# Patient Record
Sex: Female | Born: 2000 | Race: Black or African American | Hispanic: No | Marital: Single | Smoking: Never smoker
Health system: Southern US, Community
[De-identification: ages and names within clinical notes are randomized; demographics above are authoritative.]

## PROBLEM LIST (undated history)

## (undated) DIAGNOSIS — D563 Thalassemia minor: Secondary | ICD-10-CM

## (undated) DIAGNOSIS — Z789 Other specified health status: Secondary | ICD-10-CM

## (undated) HISTORY — PX: NO PAST SURGERIES: SHX2092

---
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
## 2020-03-22 DIAGNOSIS — Z20828 Contact with and (suspected) exposure to other viral communicable diseases: Secondary | ICD-10-CM | POA: Diagnosis not present

## 2020-03-26 DIAGNOSIS — R519 Headache, unspecified: Secondary | ICD-10-CM | POA: Diagnosis not present

## 2020-03-26 DIAGNOSIS — R061 Stridor: Secondary | ICD-10-CM | POA: Diagnosis not present

## 2020-03-26 DIAGNOSIS — Z20828 Contact with and (suspected) exposure to other viral communicable diseases: Secondary | ICD-10-CM | POA: Diagnosis not present

## 2020-03-26 DIAGNOSIS — R051 Acute cough: Secondary | ICD-10-CM | POA: Diagnosis not present

## 2020-03-26 DIAGNOSIS — K591 Functional diarrhea: Secondary | ICD-10-CM | POA: Diagnosis not present

## 2020-05-24 ENCOUNTER — Encounter: Payer: Self-pay | Admitting: Nurse Practitioner

## 2020-05-24 ENCOUNTER — Other Ambulatory Visit: Payer: Self-pay

## 2020-05-24 ENCOUNTER — Ambulatory Visit (INDEPENDENT_AMBULATORY_CARE_PROVIDER_SITE_OTHER): Payer: Medicaid Other | Admitting: Nurse Practitioner

## 2020-05-24 ENCOUNTER — Other Ambulatory Visit (HOSPITAL_COMMUNITY)
Admission: RE | Admit: 2020-05-24 | Discharge: 2020-05-24 | Disposition: A | Discharge status: Home / Self Care | Payer: Medicaid Other | Source: Ambulatory Visit | Attending: Nurse Practitioner | Admitting: Nurse Practitioner

## 2020-05-24 VITALS — BP 141/93 | HR 107 | Ht 64.0 in | Wt 120.4 lb

## 2020-05-24 DIAGNOSIS — Z3041 Encounter for surveillance of contraceptive pills: Secondary | ICD-10-CM

## 2020-05-24 DIAGNOSIS — Z975 Presence of (intrauterine) contraceptive device: Secondary | ICD-10-CM | POA: Insufficient documentation

## 2020-05-24 DIAGNOSIS — N921 Excessive and frequent menstruation with irregular cycle: Secondary | ICD-10-CM | POA: Diagnosis present

## 2020-05-24 DIAGNOSIS — R030 Elevated blood-pressure reading, without diagnosis of hypertension: Secondary | ICD-10-CM

## 2020-05-24 DIAGNOSIS — F4321 Adjustment disorder with depressed mood: Secondary | ICD-10-CM

## 2020-05-24 DIAGNOSIS — N939 Abnormal uterine and vaginal bleeding, unspecified: Secondary | ICD-10-CM | POA: Diagnosis not present

## 2020-05-24 DIAGNOSIS — N898 Other specified noninflammatory disorders of vagina: Secondary | ICD-10-CM | POA: Diagnosis not present

## 2020-05-24 MED ORDER — LEVONORGESTREL-ETHINYL ESTRAD 0.15-30 MG-MCG PO TABS: 1.0000 | ORAL_TABLET | Freq: Every day | ORAL | 11 refills | Status: DC

## 2020-05-24 NOTE — Progress Notes (Signed)
Patient presents for birth control consult. Patient states that she does not like the way the pills make her feel, and she is having breakthrough bleeding. She has been on the ocp for about a year. She is interested in switching to the patch. Patient declines flu vaccine today.

## 2020-05-24 NOTE — Progress Notes (Signed)
GYNECOLOGY OFFICE VISIT NOTE   History:  20 y.o. G0P0000 here today for abnormal bleeding on pills. Has taken pills while on Nexplanon to help with intermittent bleeding that continues for approx 18 months on Nexplanon.  She had Nexplanon removed and continued the pills but is having worsening periodic bleeding between menstrual cycles.  She denies any abnormal  pelvic pain or other concerns.   History reviewed. No pertinent past medical history.  History reviewed. No pertinent surgical history.  The following portions of the patient's history were reviewed and updated as appropriate: allergies, current medications, past family history, past medical history, past social history, past surgical history and problem list.     Review of Systems:  Pertinent items noted in HPI and remainder of comprehensive ROS otherwise negative.  Objective:  Physical Exam BP (!) 141/93   Pulse (!) 107   Ht 5\' 4"  (1.626 m)   Wt 120 lb 6.4 oz (54.6 kg)   BMI 20.67 kg/m  CONSTITUTIONAL: Well-developed, well-nourished female in no acute distress.  HENT:  Normocephalic, atraumatic. External right and left ear normal.  EYES: Conjunctivae and EOM are normal. Pupils are equal, round.  No scleral icterus.  NECK: Normal range of motion, supple, no masses SKIN: Skin is warm and dry. No rash noted. Not diaphoretic. No erythema. No pallor. NEUROLOGIC: Alert and oriented to person, place, and time. Normal muscle tone coordination. No cranial nerve deficit noted. PSYCHIATRIC: Normal mood and affect. Normal behavior. Normal judgment and thought content. CARDIOVASCULAR: Normal heart rate noted RESPIRATORY: Effort and breath sounds normal, no problems with respiration noted ABDOMEN: Soft, no distention noted.   PELVIC: Deferred MUSCULOSKELETAL: Normal range of motion. No edema noted.  Labs and Imaging No results found.  Assessment & Plan:  1. Abnormal uterine bleeding History of 4 days menses with no cramping  before starting hormonal contraception.  Bled on Nexplanon and had pills to help her with that irregualar bleeding for about one year.  Now she is taking pills and having breakthrough bleeding.  Reports taking pills daily and not skipping pills.  Sometimes the bleeding is spotting and sometimes it is 3 days of heavy bleeding and cramping such that this is worse than her cycles without hormones.  Is currently on a triphasic pill with norethindrone as the progesterone.  Will switch to monophasic pill with levonorgestril and 30 mcg of estrogen.  Would step to levonorgestril and 35 mcg of estrogen if BP is normal and spotting continued.  Will start this new pack of pills on Sunday and return in 4 weeks for check of BP and status of bleeding. Also did self swab to make sure infection is not causing problems with bleeding. Reviewed types of medication to take when treating cramping and encouraged to take at least 2 doses before thinking it is not working.  2. Grief reaction When asked why she is vaping, she began to cry and states her grandfather died in Aug 05, 2014 and she uses vaping to help her deal with his death.  Advised that she needs other coping strategies and to be evaluated for anxiety.  States she uses Benadryl for allergies and it also helps her sleep.  Is agreeable to seeing 2017 in the office.  - Ambulatory referral to [facility]  3. Vaginal discharge  - Cervicovaginal ancillary only  4. Encounter for surveillance of contraceptive pills If pills do not stop her bleeding or if BP continues to be elevated, will need to consider other methods of  contraception.  5. Elevated BP without diagnosis of hypertension This is the first time her blood pressure has been elevated. Will need to consider non estrogen contraceptive methods if BP remains elevated. She has a family history of elevated BP She has been vaping but denies other OTC meds or recreational drugs.  Advised to stop  vaping.   Routine preventative health maintenance measures emphasized. Please refer to After Visit Summary for other counseling recommendations.   Return in about 4 weeks (around 06/21/2020) for BP check and review her pills.   Total face-to-face time with patient: 15 minutes.  Over 50% of encounter was spent on counseling and coordination of care.  Nolene Bernheim, RN, MSN, NP-BC Nurse Practitioner, [facility] for [employer], [facility] 05/24/2020 11:59 AM

## 2020-05-27 LAB — CERVICOVAGINAL ANCILLARY ONLY
Bacterial Vaginitis (gardnerella): NEGATIVE
Candida Glabrata: NEGATIVE
Candida Vaginitis: POSITIVE — AB
Chlamydia: NEGATIVE
Comment: NEGATIVE
Comment: NEGATIVE
Comment: NEGATIVE
Comment: NEGATIVE
Comment: NEGATIVE
Comment: NORMAL
Neisseria Gonorrhea: NEGATIVE
Trichomonas: NEGATIVE

## 2020-05-27 MED ORDER — FLUCONAZOLE 150 MG PO TABS: 150.0000 mg | ORAL_TABLET | Freq: Once | ORAL | 0 refills | Status: AC

## 2020-05-27 NOTE — Addendum Note (Signed)
Addended by: Currie Paris on: 05/27/2020 05:17 PM   Modules accepted: Orders

## 2020-06-10 ENCOUNTER — Ambulatory Visit (INDEPENDENT_AMBULATORY_CARE_PROVIDER_SITE_OTHER): Payer: Medicaid Other | Admitting: Licensed Clinical Social Worker

## 2020-06-10 DIAGNOSIS — F4321 Adjustment disorder with depressed mood: Secondary | ICD-10-CM | POA: Diagnosis not present

## 2020-06-10 DIAGNOSIS — F4320 Adjustment disorder, unspecified: Secondary | ICD-10-CM

## 2020-06-10 DIAGNOSIS — F411 Generalized anxiety disorder: Secondary | ICD-10-CM | POA: Diagnosis not present

## 2020-06-12 NOTE — BH Specialist Note (Signed)
Integrated Behavioral Health via Telemedicine Visit  06/12/2020 Kim Callahan [id number]  Number of Integrated Behavioral Health visits: 1/6 Session Start time: 9:00am  Session End time: 9:24am Total time: 24 mins via mychart video   Referring Provider: Lilyan Punt NP Patient/Family location: Home  [facility] Provider location: [facility]  All persons participating in visit: LCSWA A. Figueroa and Pt. Kim Callahan  Types of Service: Individual psychotherapy and Video visit  I connected with Iyona Porr and/or Danissa Hardgrove's  n/a via  Telephone or Video Enabled Telemedicine Application  (Video is Caregility application) and verified that I am speaking with the correct person using two identifiers. Discussed confidentiality: yes  I discussed the limitations of telemedicine and the availability of in person appointments.  Discussed there is a possibility of technology failure and discussed alternative modes of communication if that failure occurs.  I discussed that engaging in this telemedicine visit, they consent to the provision of behavioral healthcare and the services will be billed under their insurance.  Patient and/or legal guardian expressed understanding and consented to Telemedicine visit: Yes   Presenting Concerns: Patient and/or family reports the following symptoms/concerns: grief and anxiety Duration of problem: Over one year ; Severity of problem: mild  Patient and/or Family's Strengths/Protective Factors: Concrete supports in place (healthy food, safe environments, etc.)  Goals Addressed: Patient will: 1.  Reduce symptoms of: anxiety and excessive grief  2.  Increase knowledge and/or ability of: coping skills  3.  Demonstrate ability to: Begin healthy grieving over loss  Progress towards Goals: Ongoing  Interventions: Interventions utilized:  Supportive Counseling Standardized Assessments completed: Not Needed   Assessment: Patient currently experiencing increased  anxiety due to grief associated with loss of grandparent   Patient may benefit from integrated behavioral health   Plan: 1. Follow up with behavioral health clinician on : 3 weeks via mychart  2. Behavioral recommendations: Incorporate new routines to [employer] grandparent, journal emotions and connect with family and friends with similar grieving emotions  3. Referral(s): Integrated [employer] (In Clinic)  I discussed the assessment and treatment plan with the patient and/or parent/guardian. They were provided an opportunity to ask questions and all were answered. They agreed with the plan and demonstrated an understanding of the instructions.   They were advised to call back or seek an in-person evaluation if the symptoms worsen or if the condition fails to improve as anticipated.  Gwyndolyn Saxon, LCSW

## 2020-06-21 ENCOUNTER — Ambulatory Visit (INDEPENDENT_AMBULATORY_CARE_PROVIDER_SITE_OTHER): Payer: Medicaid Other | Admitting: Nurse Practitioner

## 2020-06-21 ENCOUNTER — Other Ambulatory Visit: Payer: Self-pay

## 2020-06-21 ENCOUNTER — Encounter: Payer: Self-pay | Admitting: Nurse Practitioner

## 2020-06-21 VITALS — BP 137/88 | HR 114 | Wt 114.0 lb

## 2020-06-21 DIAGNOSIS — F4321 Adjustment disorder with depressed mood: Secondary | ICD-10-CM | POA: Diagnosis not present

## 2020-06-21 DIAGNOSIS — F4320 Adjustment disorder, unspecified: Secondary | ICD-10-CM

## 2020-06-21 DIAGNOSIS — Z681 Body mass index (BMI) 19 or less, adult: Secondary | ICD-10-CM | POA: Diagnosis not present

## 2020-06-21 DIAGNOSIS — Z3041 Encounter for surveillance of contraceptive pills: Secondary | ICD-10-CM

## 2020-06-21 NOTE — Progress Notes (Signed)
   GYNECOLOGY OFFICE VISIT NOTE   History:  20 y.o. G0P0000 here today for a recheck of problems identified in last visit and for BP check on BCPs.  She is using the pills and not having any side effects.  Is pleased with control of her bleeding.  She denies any abnormal vaginal discharge, bleeding, pelvic pain or other concerns.   History reviewed. No pertinent past medical history.  History reviewed. No pertinent surgical history.  The following portions of the patient's history were reviewed and updated as appropriate: allergies, current medications, past family history, past medical history, past social history, past surgical history and problem list.   Health Maintenance:  Normal pap not yet due  Review of Systems:  Pertinent items noted in HPI and remainder of comprehensive ROS otherwise negative.  Objective:  Physical Exam BP 137/88   Pulse (!) 114   Wt 114 lb (51.7 kg)   LMP 06/18/2020 (Exact Date)   BMI 19.57 kg/m  CONSTITUTIONAL: Well-developed, well-nourished female in no acute distress.  HENT:  Normocephalic, atraumatic. External right and left ear normal.  EYES: Conjunctivae and EOM are normal. Pupils are equal, round.  No scleral icterus.  NECK: Normal range of motion, supple, no masses SKIN: Skin is warm and dry. No rash noted. Not diaphoretic. No erythema. No pallor. NEUROLOGIC: Alert and oriented to person, place, and time. Normal muscle tone coordination. No cranial nerve deficit noted. PSYCHIATRIC: Normal mood and affect. Normal behavior. Normal judgment and thought content. CARDIOVASCULAR: Normal heart rate noted RESPIRATORY: Effort and breath sounds normal, no problems with respiration noted ABDOMEN: Soft, no distention noted.   PELVIC: Deferred MUSCULOSKELETAL: Normal range of motion. No edema noted.  Labs and Imaging No results found.  Assessment & Plan:  1. Encounter for surveillance of contraceptive pills Likes the pills she is on - has refills for  the year. BP less than 140/90 but would expect it to be lower given her BMI. Is still vaping some but less and trying to quit.  Wonder if vaping is causing BP increases She is planning to see PCP and given a list of providers.  2. Body mass index (BMI) of 19.0 to 19.9 in adult Is working out now as a part of her self care. Anticipates going to school soon Still working to gain weight - is conscious of her eating and making healthy choices, but is worried about weight loss of 6 pounds. Plans to add ensure to her diet.  3. Grief reaction Improved and has seen [facility] provider in the office   Routine preventative health maintenance measures emphasized. Please refer to After Visit Summary for other counseling recommendations.   Return in about 9 months (around 03/23/2021).   Total face-to-face time with patient: 10 minutes.  Over 50% of encounter was spent on counseling and coordination of care.  Nolene Bernheim, RN, MSN, NP-BC Nurse Practitioner, [facility] for [employer], [facility] 06/21/2020 12:03 PM

## 2020-06-21 NOTE — Patient Instructions (Signed)
AREA FAMILY PRACTICE PHYSICIANS  [place] ([address]) . [facility] o [address] [place], [place] [address] o [phone] o Mon-Fri 8:30-12:30, 1:30-5:00 o Accepting Medicaid . [facility] o [address], [place], [place] [address] o [phone] o Mon-Fri 8:00-5:30 . [facility] o [address] [place], [place] [address] o [phone] o Mon, Tue, Thur, Fri 8:30-5:00, Wed 10:00-7:00 (closed 1-2pm) o Accepting Medicaid . [facility] o [address], [place], [place]  [address] o Phone - [phone]   Fax - [phone]  [place] ([address]) . [facility] o [address] [place], [place] [address] o [phone] o Mon-Fri 8:00-5:00 . [facility] ([facility]  o [address] [place], [place] [address] o [phone] o Mon-Fri 8:30-5:30, Sat (Oct.-Mar.) 9:00-1:00 o Accepting Medicaid  [place] ([address]) . [facility] o [address], [place], [place] [address] o [phone] o Mon-Fri 8:00-5:00  [place] ([address]) . [facility] o [address], [place], [place] [address] o [phone] o Mon-Fri 8:00-5:00 . [facility] o [address], [place], [place] [address] o [phone] o Mon-Fri 8:00-5:00 . [facility] o [address] [place], [place] [address] o [phone] o Mon-Fri 8:00-5:00 . [facility] o [address] [place] [address] o [phone] o Mon-Fri 7:30-5:30  [place] ([address] & [address]) . [facility] o [address] [place], [place] [address] o [phone] o Mon-Thur 8:00-6:00 o Accepting Medicaid . [facility] o [address] [place], [place] [address] o [phone] o Mon-Thur 7:30-7:30, Fri 7:30-4:30 o Accepting Medicaid . [facility] o [address], [place], [place]  [address] o [phone]   Fax - [phone]  [place]  ([address] & [address]) . [facility] [facility] o [address] , [place] [address] o [phone] o Mon-Fri 7:00-5:00 . [facility] o [address], [place], [place] [address] o [phone] o Mon-Fri 8:00-5:00 o Accepting Medicaid . [facility] o [address], , [place] [address] o [phone] o Mon-Fri 8:00-5:00 o Accepting Medicaid  [place] ([address]) . [facility] [place] o [address] [place], [place] [address] o [phone] o Mon-Fri 8:00-5:00 . [facility] o [address], [place], [place] [address] o [phone] o Mon-Fri 8:00-5:00 o Accepting Medicaid . [facility] ([facility] o [address], [place], [place] [address] o [phone] o Mon-Fri 8:00-5:30, Sat&Sun by appointment (phones open at 8:30) o Accepting Medicaid  [place] ([address]) . [facility] o [address] [place], [place] [address] o [phone] o Mon-Thur 8:00-7:00, Fri 8:00-5:00, Sat 8:00-12:00, Sun 9:00-12:00 o Accepting Medicaid . [facility] [place] o [address], [place], [place] [address] o [phone] o Mon-Thur 8:00-5:00 o Accepting Medicaid . [facility] o [address] [place],  [address] o [phone] o Mon-Fri 8:00-5:30, Sat (Oct.-Mar.) 9:00-1:00 o Accepting Medicaid  [place]  ([address]) .   [facility] o [address], [place], [place] [address] o [phone] o Mon-Fri 8:00-5:00 o Accepting Medicaid   [place] ([address]) . [facility] [place] o [address], [place], [place] [address] o [phone] o Mon-Fri 8:00-5:00 . [facility] [place] o [address], [place], [place] [address] o [phone] o Mon-Fri 8:00-5:00 . [facility] [place] o [address] [place] [address], [place], [place] [address] o [phone] o Mon-Fri 8:00-5:00 o After hours clinic ([address] , [place] [address]) [phone] Mon-Fri 5:00-8:00, Sat 12:00-6:00, Sun 10:00-4:00 o Accepting Medicaid . [facility] [place] o [address], [place], [place]  [address] o [phone]   Fax - [phone]  [place] ([address]) . [facility] [place] o [address], [place], [place] [address] o [phone] o Mon-Fri 8:00-5:00 . [facility] [place] ([facility] [place]) o [address], [place], [place] [address] o [phone] o Mon-Thur 8:00-7:00, Fri 8:00-5:00, Sat 8:00-12:00    

## 2020-06-21 NOTE — Progress Notes (Signed)
RGYN F/U   LMP: 06/18/20  Contraception: OCPs  has no complaints w/ pills at this time.    Pt states abnormal bleeding has stopped. pt states she is eating more, dizziness has improved and not feeling sad as much anymore.

## 2020-06-25 ENCOUNTER — Other Ambulatory Visit: Payer: Self-pay

## 2020-06-25 MED ORDER — ALBUTEROL SULFATE HFA 108 (90 BASE) MCG/ACT IN AERS: 1.0000 | INHALATION_SPRAY | Freq: Four times a day (QID) | RESPIRATORY_TRACT | 0 refills | Status: DC | PRN

## 2020-06-25 NOTE — Telephone Encounter (Signed)
Refill on Inhaler. Patient will need to find a pcp for additional refills.

## 2020-06-30 ENCOUNTER — Other Ambulatory Visit: Payer: Self-pay | Admitting: Nurse Practitioner

## 2020-08-11 DIAGNOSIS — S46812A Strain of other muscles, fascia and tendons at shoulder and upper arm level, left arm, initial encounter: Secondary | ICD-10-CM | POA: Diagnosis not present

## 2020-08-12 ENCOUNTER — Ambulatory Visit: Payer: Medicaid Other | Admitting: Obstetrics and Gynecology

## 2020-08-14 ENCOUNTER — Other Ambulatory Visit (HOSPITAL_COMMUNITY)
Admission: RE | Admit: 2020-08-14 | Discharge: 2020-08-14 | Disposition: A | Discharge status: Home / Self Care | Payer: Medicaid Other | Source: Ambulatory Visit | Attending: Obstetrics and Gynecology | Admitting: Obstetrics and Gynecology

## 2020-08-14 ENCOUNTER — Other Ambulatory Visit: Payer: Self-pay

## 2020-08-14 ENCOUNTER — Ambulatory Visit (INDEPENDENT_AMBULATORY_CARE_PROVIDER_SITE_OTHER): Payer: Medicaid Other

## 2020-08-14 DIAGNOSIS — N898 Other specified noninflammatory disorders of vagina: Secondary | ICD-10-CM | POA: Insufficient documentation

## 2020-08-14 DIAGNOSIS — Z113 Encounter for screening for infections with a predominantly sexual mode of transmission: Secondary | ICD-10-CM | POA: Diagnosis not present

## 2020-08-14 NOTE — Progress Notes (Signed)
Pt is in the office for self swab, std testing. Pt reports vaginal discharge and irritation.

## 2020-08-14 NOTE — Progress Notes (Signed)
Agree with A & P. 

## 2020-08-15 ENCOUNTER — Other Ambulatory Visit: Payer: Self-pay

## 2020-08-15 DIAGNOSIS — B379 Candidiasis, unspecified: Secondary | ICD-10-CM

## 2020-08-15 LAB — CERVICOVAGINAL ANCILLARY ONLY
Bacterial Vaginitis (gardnerella): NEGATIVE
Candida Glabrata: NEGATIVE
Candida Vaginitis: POSITIVE — AB
Chlamydia: NEGATIVE
Comment: NEGATIVE
Comment: NEGATIVE
Comment: NEGATIVE
Comment: NEGATIVE
Comment: NEGATIVE
Comment: NORMAL
Neisseria Gonorrhea: NEGATIVE
Trichomonas: NEGATIVE

## 2020-08-15 LAB — HIV ANTIBODY (ROUTINE TESTING W REFLEX): HIV Screen 4th Generation wRfx: NONREACTIVE

## 2020-08-15 LAB — HEPATITIS B SURFACE ANTIGEN: Hepatitis B Surface Ag: NEGATIVE

## 2020-08-15 LAB — HEPATITIS C ANTIBODY: Hep C Virus Ab: <0.1 s/co ratio (ref 0.0–0.9)

## 2020-08-15 LAB — RPR: RPR Ser Ql: NONREACTIVE

## 2020-08-15 MED ORDER — FLUCONAZOLE 150 MG PO TABS
150.0000 mg | ORAL_TABLET | Freq: Once | ORAL | 0 refills | Status: AC
Start: 2020-08-15 — End: 2020-08-15

## 2020-09-09 DIAGNOSIS — R369 Urethral discharge, unspecified: Secondary | ICD-10-CM | POA: Diagnosis not present

## 2020-09-09 DIAGNOSIS — N898 Other specified noninflammatory disorders of vagina: Secondary | ICD-10-CM | POA: Diagnosis not present

## 2020-10-04 ENCOUNTER — Ambulatory Visit: Payer: Medicaid Other | Admitting: Legal Medicine

## 2020-10-09 DIAGNOSIS — R6889 Other general symptoms and signs: Secondary | ICD-10-CM | POA: Diagnosis not present

## 2020-10-09 DIAGNOSIS — Z681 Body mass index (BMI) 19 or less, adult: Secondary | ICD-10-CM | POA: Diagnosis not present

## 2020-10-09 DIAGNOSIS — R634 Abnormal weight loss: Secondary | ICD-10-CM | POA: Diagnosis not present

## 2020-10-09 DIAGNOSIS — R1013 Epigastric pain: Secondary | ICD-10-CM | POA: Diagnosis not present

## 2020-10-16 DIAGNOSIS — F322 Major depressive disorder, single episode, severe without psychotic features: Secondary | ICD-10-CM | POA: Diagnosis not present

## 2020-10-16 DIAGNOSIS — Z681 Body mass index (BMI) 19 or less, adult: Secondary | ICD-10-CM | POA: Diagnosis not present

## 2020-10-16 DIAGNOSIS — R634 Abnormal weight loss: Secondary | ICD-10-CM | POA: Diagnosis not present

## 2020-10-16 DIAGNOSIS — F411 Generalized anxiety disorder: Secondary | ICD-10-CM | POA: Diagnosis not present

## 2020-10-23 DIAGNOSIS — R55 Syncope and collapse: Secondary | ICD-10-CM | POA: Diagnosis not present

## 2020-11-13 DIAGNOSIS — N898 Other specified noninflammatory disorders of vagina: Secondary | ICD-10-CM | POA: Diagnosis not present

## 2020-11-13 DIAGNOSIS — Z3A01 Less than 8 weeks gestation of pregnancy: Secondary | ICD-10-CM | POA: Diagnosis not present

## 2020-11-13 DIAGNOSIS — Z3401 Encounter for supervision of normal first pregnancy, first trimester: Secondary | ICD-10-CM | POA: Diagnosis not present

## 2020-11-13 DIAGNOSIS — F322 Major depressive disorder, single episode, severe without psychotic features: Secondary | ICD-10-CM | POA: Diagnosis not present

## 2020-11-14 ENCOUNTER — Emergency Department (HOSPITAL_COMMUNITY)
Admission: EM | Admit: 2020-11-14 | Discharge: 2020-11-15 | Disposition: A | Discharge status: Home / Self Care | Payer: Medicaid Other | Attending: Emergency Medicine | Admitting: Emergency Medicine

## 2020-11-14 DIAGNOSIS — R110 Nausea: Secondary | ICD-10-CM | POA: Diagnosis not present

## 2020-11-14 DIAGNOSIS — O26891 Other specified pregnancy related conditions, first trimester: Secondary | ICD-10-CM | POA: Insufficient documentation

## 2020-11-14 DIAGNOSIS — N898 Other specified noninflammatory disorders of vagina: Secondary | ICD-10-CM | POA: Insufficient documentation

## 2020-11-14 DIAGNOSIS — O219 Vomiting of pregnancy, unspecified: Secondary | ICD-10-CM | POA: Diagnosis not present

## 2020-11-14 DIAGNOSIS — R1030 Lower abdominal pain, unspecified: Secondary | ICD-10-CM | POA: Diagnosis not present

## 2020-11-14 DIAGNOSIS — M549 Dorsalgia, unspecified: Secondary | ICD-10-CM | POA: Insufficient documentation

## 2020-11-14 DIAGNOSIS — Z3A01 Less than 8 weeks gestation of pregnancy: Secondary | ICD-10-CM | POA: Insufficient documentation

## 2020-11-14 NOTE — ED Triage Notes (Signed)
Pt c/o abd pain, lower back pain, nausea x1.5wks, LMP 7/25. Endorses increased vaginal discharge, denies odor. Denies new sexual partners, does not use protection, concerned for pregnancy.

## 2020-11-15 LAB — COMPREHENSIVE METABOLIC PANEL
ALT: 29 U/L (ref 0–44)
AST: 24 U/L (ref 15–41)
Albumin: 4.2 g/dL (ref 3.5–5.0)
Alkaline Phosphatase: 52 U/L (ref 38–126)
Anion gap: 9 (ref 5–15)
BUN: 17 mg/dL (ref 6–20)
CO2: 22 mmol/L (ref 22–32)
Calcium: 9.3 mg/dL (ref 8.9–10.3)
Chloride: 103 mmol/L (ref 98–111)
Creatinine, Ser: 0.86 mg/dL (ref 0.44–1.00)
GFR, Estimated: >60 mL/min (ref >60)
Glucose, Bld: 88 mg/dL (ref 70–99)
Potassium: 4 mmol/L (ref 3.5–5.1)
Sodium: 134 mmol/L — ABNORMAL LOW (ref 135–145)
Total Bilirubin: 0.7 mg/dL (ref 0.3–1.2)
Total Protein: 7.6 g/dL (ref 6.5–8.1)

## 2020-11-15 LAB — CBC
HCT: 40.6 % (ref 36.0–46.0)
Hemoglobin: 13.2 g/dL (ref 12.0–15.0)
MCH: 28.7 pg (ref 26.0–34.0)
MCHC: 32.5 g/dL (ref 30.0–36.0)
MCV: 88.3 fL (ref 80.0–100.0)
Platelets: 204 10*3/uL (ref 150–400)
RBC: 4.6 MIL/uL (ref 3.87–5.11)
RDW: 13.3 % (ref 11.5–15.5)
WBC: 7 10*3/uL (ref 4.0–10.5)
nRBC: 0 % (ref 0.0–0.2)

## 2020-11-15 LAB — URINALYSIS, ROUTINE W REFLEX MICROSCOPIC
Bilirubin Urine: NEGATIVE
Glucose, UA: NEGATIVE mg/dL
Hgb urine dipstick: NEGATIVE
Ketones, ur: 20 mg/dL — AB
Leukocytes,Ua: NEGATIVE
Nitrite: NEGATIVE
Protein, ur: NEGATIVE mg/dL
Specific Gravity, Urine: 1.021 (ref 1.005–1.030)
pH: 6 (ref 5.0–8.0)

## 2020-11-15 LAB — LIPASE, BLOOD: Lipase: 39 U/L (ref 11–51)

## 2020-11-15 LAB — I-STAT BETA HCG BLOOD, ED (MC, WL, AP ONLY): I-stat hCG, quantitative: 73.1 m[IU]/mL — ABNORMAL HIGH (ref <5)

## 2020-11-15 MED ORDER — PRENATAL COMPLETE 14-0.4 MG PO TABS: 1.0000 | ORAL_TABLET | Freq: Every day | ORAL | 0 refills | Status: DC

## 2020-11-15 MED ORDER — DOXYLAMINE-PYRIDOXINE 10-10 MG PO TBEC: 1.0000 | DELAYED_RELEASE_TABLET | Freq: Every evening | ORAL | 0 refills | Status: DC | PRN

## 2020-11-15 NOTE — Discharge Instructions (Signed)
Pregnancy is very early, 2-3 weeks at most. You will need follow-up with OB-GYN for ongoing prenatal care. Please try to take your vitamins daily, can use diclegis for nausea/vomiting. If you need more emergent care-- can come to [facility]U center at [facility] (Entrance C).

## 2020-11-15 NOTE — ED Provider Notes (Signed)
[facility] EMERGENCY DEPARTMENT Provider Note   CSN: [id number] Arrival date & time: 11/14/20  2344     History Chief Complaint  Patient presents with   Abdominal Pain   Nausea   Amenorrhea    LMP 7/25    Kim Callahan is a 20 y.o. female.  The history is provided by the patient and medical records.  Abdominal Pain Associated symptoms: nausea    20 year old female presenting to the ED with lower abdominal discomfort, mild back pain, and nausea for the past week and a half.  States she has a few days late on her menstrual cycle, however her cycles are usually fairly regular.  She does report some clear/white vaginal discharge, however this is unchanged from baseline for her.  She denies any vaginal discomfort, itching, or other irritation.  She has not had any spotting.  She denies any fever or rash.  She is not currently on birth control.  She is concerned for pregnancy.  No past medical history on file.  Patient Active Problem List   Diagnosis Date Noted   Breakthrough bleeding on Nexplanon 04/14/2018    No past surgical history on file.   OB History     Gravida  0   Para  0   Term  0   Preterm  0   AB  0   Living  0      SAB  0   IAB  0   Ectopic  0   Multiple  0   Live Births  0           Family History  Problem Relation Age of Onset   Hypertension Mother    Asthma Mother     Social History   Tobacco Use   Smoking status: Never   Smokeless tobacco: Never  Vaping Use   Vaping Use: Never used  Substance Use Topics   Alcohol use: No   Drug use: No    Home Medications Prior to Admission medications   Medication Sig Start Date End Date Taking? Authorizing Provider  Doxylamine-Pyridoxine (DICLEGIS) 10-10 MG TBEC Take 1-2 tablets by mouth at bedtime as needed (nausea). 11/15/20  Yes Garlon Hatchet, PA-C  Prenatal Vit-Fe Fumarate-FA (PRENATAL COMPLETE) 14-0.4 MG TABS Take 1 tablet by mouth daily. 11/15/20  Yes  Garlon Hatchet, PA-C  albuterol (VENTOLIN HFA) 108 (90 Base) MCG/ACT inhaler Inhale 1-2 puffs into the lungs every 6 (six) hours as needed for wheezing or shortness of breath. 06/25/20   Brock Bad, MD  fluticasone (FLONASE) 50 MCG/ACT nasal spray Place 1 spray into both nostrils daily. Patient not taking: Reported on 08/14/2020 09/08/19   Janace Aris, NP  levonorgestrel-ethinyl estradiol (PORTIA-28) 0.15-30 MG-MCG tablet Take 1 tablet by mouth daily. Patient not taking: Reported on 08/14/2020 05/24/20   Currie Paris, NP  tizanidine (ZANAFLEX) 2 MG capsule Take 1 capsule (2 mg total) by mouth at bedtime as needed for muscle spasms. Patient not taking: No sig reported 03/02/19   Wallis Bamberg, PA-C  cetirizine (ZYRTEC) 10 MG tablet Take 1 tablet (10 mg total) by mouth daily. Patient not taking: Reported on 04/14/2018 12/03/17 11/28/18  Georgetta Haber, NP    Allergies    Medroxyprogesterone  Review of Systems   Review of Systems  Gastrointestinal:  Positive for abdominal pain and nausea.  Genitourinary:  Positive for menstrual problem.  All other systems reviewed and are negative.  Physical Exam Updated Vital Signs BP  115/68   Pulse 78   Temp 98 F (36.7 C)   Resp 16   SpO2 100%   Physical Exam Vitals and nursing note reviewed.  Constitutional:      Appearance: She is well-developed.  HENT:     Head: Normocephalic and atraumatic.  Eyes:     Conjunctiva/sclera: Conjunctivae normal.     Pupils: Pupils are equal, round, and reactive to light.  Cardiovascular:     Rate and Rhythm: Normal rate and regular rhythm.     Heart sounds: Normal heart sounds.  Pulmonary:     Effort: Pulmonary effort is normal.     Breath sounds: Normal breath sounds.  Abdominal:     General: Bowel sounds are normal.     Palpations: Abdomen is soft.     Tenderness: There is no abdominal tenderness. There is no guarding or rebound.     Comments: Soft, nontender, normal bowel sounds throughout   Musculoskeletal:        General: Normal range of motion.     Cervical back: Normal range of motion.  Skin:    General: Skin is warm and dry.  Neurological:     Mental Status: She is alert and oriented to person, place, and time.    ED Results / Procedures / Treatments   Labs (all labs ordered are listed, but only abnormal results are displayed) Labs Reviewed  COMPREHENSIVE METABOLIC PANEL - Abnormal; Notable for the following components:      Result Value   Sodium 134 (*)    All other components within normal limits  URINALYSIS, ROUTINE W REFLEX MICROSCOPIC - Abnormal; Notable for the following components:   APPearance HAZY (*)    Ketones, ur 20 (*)    All other components within normal limits  I-STAT BETA HCG BLOOD, ED (MC, [facility], AP ONLY) - Abnormal; Notable for the following components:   I-stat hCG, quantitative 73.1 (*)    All other components within normal limits  LIPASE, BLOOD  CBC    EKG None  Radiology No results found.  Procedures Procedures   Medications Ordered in ED Medications - No data to display  ED Course  I have reviewed the triage vital signs and the nursing notes.  Pertinent labs & imaging results that were available during my care of the patient were reviewed by me and considered in my medical decision making (see chart for details).    MDM Rules/Calculators/A&P                           20 year old female here with concern of pregnancy.  Menstrual cycle is a few days late.   She not had any irregular bleeding.  She is afebrile and nontoxic in appearance here.  Her abdomen is soft and nontender.  Does report some clear/white vaginal discharge, however reports this is normal for her.  She is no expressed concern for STD.  Work-up is consistent with early pregnancy, likely 2- 3 weeks.  Will start on prenatals, can use diclegis for nausea.  She has appt with OB-GYN in just a few weeks.  Advised she may return to MAU unit if new/acute changes-- severe  pain, bleeding, fevers, etc.  Final Clinical Impression(s) / ED Diagnoses Final diagnoses:  Less than [redacted] weeks gestation of pregnancy  Nausea    Rx / DC Orders ED Discharge Orders          Ordered     11/15/20 0305  Doxylamine-Pyridoxine (DICLEGIS) 10-10 MG TBEC  At bedtime PRN        11/15/20 0305    Prenatal Vit-Fe Fumarate-FA (PRENATAL COMPLETE) 14-0.4 MG TABS  Daily        11/15/20 0308             Garlon Hatchet, PA-C 11/15/20 0341    Sabas Sous, MD 11/15/20 [phone]

## 2020-11-18 ENCOUNTER — Telehealth: Payer: Self-pay | Admitting: *Deleted

## 2020-11-18 NOTE — Telephone Encounter (Signed)
Transition Care Management Unsuccessful Follow-up Telephone Call  Date of discharge and from where:  11/15/2020 - [facility]  Attempts:  1st Attempt  Reason for unsuccessful TCM follow-up call:  Voice mail full

## 2020-11-19 NOTE — Telephone Encounter (Signed)
Transition Care Management Unsuccessful Follow-up Telephone Call  Date of discharge and from where:  11/15/2020 - [facility]  Attempts:  2nd Attempt  Reason for unsuccessful TCM follow-up call:  Voice mail full

## 2020-11-20 NOTE — Telephone Encounter (Signed)
Transition Care Management Follow-up Telephone Call Date of discharge and from where: 11/15/2020 - [facility] How have you been since you were released from the hospital? "I am okay" Any questions or concerns? No  Items Reviewed: Did the pt receive and understand the discharge instructions provided? Yes  Medications obtained and verified? Yes  Other? No  Any new allergies since your discharge? No  Dietary orders reviewed? No Do you have support at home? Yes    Functional Questionnaire: (I = Independent and D = Dependent) ADLs: I  Bathing/Dressing- I  Meal Prep- I  Eating- I  Maintaining continence- I  Transferring/Ambulation- I  Managing Meds- I  Follow up appointments reviewed:  PCP Hospital f/u appt confirmed? No  Specialist Hospital f/u appt confirmed? No   Are transportation arrangements needed? No  If their condition worsens, is the pt aware to call PCP or go to the Emergency Dept.? Yes Was the patient provided with contact information for the PCP's office or ED? Yes Was to pt encouraged to call back with questions or concerns? Yes

## 2020-11-25 DIAGNOSIS — O208 Other hemorrhage in early pregnancy: Secondary | ICD-10-CM | POA: Diagnosis not present

## 2020-11-25 DIAGNOSIS — Z3A00 Weeks of gestation of pregnancy not specified: Secondary | ICD-10-CM | POA: Diagnosis not present

## 2020-11-26 ENCOUNTER — Encounter (HOSPITAL_COMMUNITY): Payer: Self-pay

## 2020-11-26 ENCOUNTER — Other Ambulatory Visit: Payer: Self-pay

## 2020-11-26 ENCOUNTER — Inpatient Hospital Stay (HOSPITAL_COMMUNITY)
Admission: AD | Admit: 2020-11-26 | Discharge: 2020-11-27 | Disposition: A | Discharge status: Home / Self Care | Payer: Medicaid Other | Attending: Obstetrics & Gynecology | Admitting: Obstetrics & Gynecology

## 2020-11-26 DIAGNOSIS — O039 Complete or unspecified spontaneous abortion without complication: Secondary | ICD-10-CM | POA: Diagnosis not present

## 2020-11-26 DIAGNOSIS — Z3A01 Less than 8 weeks gestation of pregnancy: Secondary | ICD-10-CM | POA: Insufficient documentation

## 2020-11-26 DIAGNOSIS — O209 Hemorrhage in early pregnancy, unspecified: Secondary | ICD-10-CM

## 2020-11-26 HISTORY — DX: Other specified health status: Z78.9

## 2020-11-26 NOTE — ED Notes (Signed)
Report called to MAU RN 

## 2020-11-26 NOTE — ED Triage Notes (Signed)
Pt states she was told she was pregnant [redacted] weeks ago and 2 days ago she started having vaginal bleeding. Pt also reports cramping.

## 2020-11-26 NOTE — MAU Note (Signed)
Pt presented to main ED with VB. Was sent over from ED. Pt states she has had VB for 3 days. Bleeding is like period. Having abdominal cramping as well fro 3 days.

## 2020-11-26 NOTE — ED Provider Notes (Signed)
Emergency Medicine Provider OB Triage Evaluation Note  Kim Callahan is a 20 y.o. female, G0P0000, at Unknown gestation who presents to the emergency department with complaints of vaginal bleeding.  Bleeding has been present over the last 2 days.  Patient reports that she is changing her pad or tampon approximately every 1 hour.  Patient endorses lower abdominal cramping.    LMP 7/25.  G1 P0-0-0-0  Review of  Systems  Positive: Vaginal bleeding, abdominal cramping Negative: Dysuria, urinary frequency, vaginal pain, vaginal discharge  Physical Exam  BP (!) 138/93 (BP Location: Right Arm)   Pulse 93   Temp 98.5 F (36.9 C) (Oral)   Resp 16   Ht 5\' 4"  (1.626 m)   Wt 52.2 kg   LMP 10/14/2020   SpO2 100%   BMI 19.74 kg/m  General: Awake, no distress  HEENT: Atraumatic  Resp: Normal effort  Cardiac: Normal rate Abd: Nondistended, nontender  MSK: Moves all extremities without difficulty Neuro: Speech clear  Medical Decision Making  Pt evaluated for pregnancy concern and is stable for transfer to MAU. Pt is in agreement with plan for transfer.  9:46 PM Discussed with MAU APP, 10/16/2020, who accepts patient in transfer.  Clinical Impression  No diagnosis found.     Corrie Dandy 11/26/20 2150    2151, MD 11/26/20 [phone]

## 2020-11-26 NOTE — ED Notes (Signed)
transferred to MAU

## 2020-11-27 ENCOUNTER — Encounter (HOSPITAL_COMMUNITY): Payer: Self-pay | Admitting: Obstetrics & Gynecology

## 2020-11-27 DIAGNOSIS — O039 Complete or unspecified spontaneous abortion without complication: Secondary | ICD-10-CM

## 2020-11-27 DIAGNOSIS — Z3A01 Less than 8 weeks gestation of pregnancy: Secondary | ICD-10-CM | POA: Diagnosis not present

## 2020-11-27 DIAGNOSIS — O209 Hemorrhage in early pregnancy, unspecified: Secondary | ICD-10-CM | POA: Diagnosis present

## 2020-11-27 LAB — URINALYSIS, ROUTINE W REFLEX MICROSCOPIC
Bacteria, UA: NONE SEEN
Bilirubin Urine: NEGATIVE
Glucose, UA: NEGATIVE mg/dL
Ketones, ur: NEGATIVE mg/dL
Leukocytes,Ua: NEGATIVE
Nitrite: NEGATIVE
Protein, ur: NEGATIVE mg/dL
Specific Gravity, Urine: 1.021 (ref 1.005–1.030)
pH: 6 (ref 5.0–8.0)

## 2020-11-27 LAB — CBC
HCT: 36 % (ref 36.0–46.0)
Hemoglobin: 11.5 g/dL — ABNORMAL LOW (ref 12.0–15.0)
MCH: 28.4 pg (ref 26.0–34.0)
MCHC: 31.9 g/dL (ref 30.0–36.0)
MCV: 88.9 fL (ref 80.0–100.0)
Platelets: 170 10*3/uL (ref 150–400)
RBC: 4.05 MIL/uL (ref 3.87–5.11)
RDW: 13.3 % (ref 11.5–15.5)
WBC: 4.9 10*3/uL (ref 4.0–10.5)
nRBC: 0 % (ref 0.0–0.2)

## 2020-11-27 LAB — GC/CHLAMYDIA PROBE AMP (~~LOC~~) NOT AT ARMC
Chlamydia: NEGATIVE
Comment: NEGATIVE
Comment: NORMAL
Neisseria Gonorrhea: NEGATIVE

## 2020-11-27 LAB — ABO/RH: ABO/RH(D): A POS

## 2020-11-27 LAB — WET PREP, GENITAL
Sperm: NONE SEEN
Trich, Wet Prep: NONE SEEN
Yeast Wet Prep HPF POC: NONE SEEN

## 2020-11-27 LAB — HCG, QUANTITATIVE, PREGNANCY: hCG, Beta Chain, Quant, S: 6 m[IU]/mL — ABNORMAL HIGH (ref <5)

## 2020-11-27 NOTE — MAU Provider Note (Signed)
Chief Complaint: Vaginal Bleeding   Event Date/Time   First Provider Initiated Contact with Patient 11/27/20 0103        SUBJECTIVE HPI: Kim Callahan is a 20 y.o. G1P0000 at [redacted]w[redacted]d by LMP who presents to maternity admissions reporting bleeding and cramping for 3 days.  . She denies urinary symptoms, h/a, dizziness, n/v, or fever/chills.    Was seen in ED on 8/26 for nausea, HCG found to be 73.   Vaginal Bleeding The patient's primary symptoms include pelvic pain and vaginal bleeding. The patient's pertinent negatives include no genital itching, genital lesions or genital odor. This is a new problem. The current episode started in the past 7 days. The pain is mild. She is pregnant. Associated symptoms include abdominal pain. Pertinent negatives include no chills, constipation, diarrhea, fever or headaches. The vaginal discharge was bloody. The vaginal bleeding is typical of menses. She has not been passing clots. She has not been passing tissue. Nothing aggravates the symptoms. She has tried nothing for the symptoms.   RN Note: Pt presented to main ED with VB. Was sent over from ED. Pt states she has had VB for 3 days. Bleeding is like period. Having abdominal cramping as well fro 3 days  Past Medical History:  Diagnosis Date   Medical history non-contributory    Past Surgical History:  Procedure Laterality Date   NO PAST SURGERIES     Social History   Socioeconomic History   Marital status: Single    Spouse name: Not on file   Number of children: Not on file   Years of education: Not on file   Highest education level: Not on file  Occupational History   Not on file  Tobacco Use   Smoking status: Never   Smokeless tobacco: Never  Vaping Use   Vaping Use: Never used  Substance and Sexual Activity   Alcohol use: No   Drug use: No   Sexual activity: Yes    Partners: Male  Other Topics Concern   Not on file  Social History Narrative   Not on file   Social Determinants of  Health   Financial Resource Strain: Not on file  Food Insecurity: Not on file  Transportation Needs: Not on file  Physical Activity: Not on file  Stress: Not on file  Social Connections: Not on file  Intimate Partner Violence: Not on file   No current facility-administered medications on file prior to encounter.   Current Outpatient Medications on File Prior to Encounter  Medication Sig Dispense Refill   mirtazapine (REMERON) 7.5 MG tablet Take 7.5 mg by mouth at bedtime.     Prenatal Vit-Fe Fumarate-FA (PRENATAL COMPLETE) 14-0.4 MG TABS Take 1 tablet by mouth daily. 60 tablet 0   albuterol (VENTOLIN HFA) 108 (90 Base) MCG/ACT inhaler Inhale 1-2 puffs into the lungs every 6 (six) hours as needed for wheezing or shortness of breath. 18 g 0   Doxylamine-Pyridoxine (DICLEGIS) 10-10 MG TBEC Take 1-2 tablets by mouth at bedtime as needed (nausea). 60 tablet 0   fluticasone (FLONASE) 50 MCG/ACT nasal spray Place 1 spray into both nostrils daily. (Patient not taking: Reported on 08/14/2020) 16 g 2   levonorgestrel-ethinyl estradiol (PORTIA-28) 0.15-30 MG-MCG tablet Take 1 tablet by mouth daily. (Patient not taking: Reported on 08/14/2020) 28 tablet 11   tizanidine (ZANAFLEX) 2 MG capsule Take 1 capsule (2 mg total) by mouth at bedtime as needed for muscle spasms. (Patient not taking: No sig reported) 60 capsule 0   [  DISCONTINUED] cetirizine (ZYRTEC) 10 MG tablet Take 1 tablet (10 mg total) by mouth daily. (Patient not taking: Reported on 04/14/2018) 30 tablet 0   Allergies  Allergen Reactions   Medroxyprogesterone Itching and Rash    I have reviewed patient's Past Medical Hx, Surgical Hx, Family Hx, Social Hx, medications and allergies.   ROS:  Review of Systems  Constitutional:  Negative for chills and fever.  Gastrointestinal:  Positive for abdominal pain. Negative for constipation and diarrhea.  Genitourinary:  Positive for pelvic pain and vaginal bleeding.  Neurological:  Negative for  headaches.  Review of Systems  Other systems negative   Physical Exam  Physical Exam Patient Vitals for the past 24 hrs:  BP Temp Temp src Pulse Resp SpO2 Height Weight  11/26/20 2352 130/80 98.3 F (36.8 C) -- 64 16 -- 5\' 4"  (1.626 m) 53.1 kg  11/26/20 2148 (!) 138/93 98.5 F (36.9 C) Oral 93 16 100 % -- --  11/26/20 2146 -- -- -- -- -- -- 5\' 4"  (1.626 m) 52.2 kg   Constitutional: Well-developed, well-nourished female in no acute distress.  Cardiovascular: normal rate Respiratory: normal effort GI: Abd soft, non-tender.  MS: Extremities nontender, no edema, normal ROM Neurologic: Alert and oriented x 4.  GU: Neg CVAT.  PELVIC EXAM: Cervix pink, visually closed, without lesion, scant bloody discharge, vaginal walls and external genitalia normal  LAB RESULTS Results for orders placed or performed during the hospital encounter of 11/26/20 (from the past 48 hour(s))  Urinalysis, Routine w reflex microscopic Urine, Clean Catch     Status: Abnormal   Collection Time: 11/27/20 12:02 AM  Result Value Ref Range   Color, Urine YELLOW YELLOW   APPearance HAZY (A) CLEAR   Specific Gravity, Urine 1.021 1.005 - 1.030   pH 6.0 5.0 - 8.0   Glucose, UA NEGATIVE NEGATIVE mg/dL   Hgb urine dipstick MODERATE (A) NEGATIVE   Bilirubin Urine NEGATIVE NEGATIVE   Ketones, ur NEGATIVE NEGATIVE mg/dL   Protein, ur NEGATIVE NEGATIVE mg/dL   Nitrite NEGATIVE NEGATIVE   Leukocytes,Ua NEGATIVE NEGATIVE   RBC / HPF 0-5 0 - 5 RBC/hpf   WBC, UA 0-5 0 - 5 WBC/hpf   Bacteria, UA NONE SEEN NONE SEEN   Squamous Epithelial / LPF 6-10 0 - 5   Mucus PRESENT     Comment: Performed at [facility], [address], [place], [address] [place]  GC/Chlamydia probe amp ([place])not at [facility]     Status: None   Collection Time: 11/27/20 12:43 AM  Result Value Ref Range   Neisseria Gonorrhea Negative    Chlamydia Negative    Comment Normal Reference Ranger Chlamydia - Negative    Comment      Normal  Reference Range Neisseria Gonorrhea - Negative  Wet prep, genital     Status: Abnormal   Collection Time: 11/27/20 12:43 AM   Specimen: PATH Cytology Cervicovaginal Ancillary Only  Result Value Ref Range   Yeast Wet Prep HPF POC NONE SEEN NONE SEEN   Trich, Wet Prep NONE SEEN NONE SEEN   Clue Cells Wet Prep HPF POC PRESENT (A) NONE SEEN   WBC, Wet Prep HPF POC MODERATE (A) NONE SEEN   Sperm NONE SEEN     Comment: Performed at [facility], [address] [place], [address] [place]  CBC     Status: Abnormal   Collection Time: 11/27/20 12:44 AM  Result Value Ref Range   WBC 4.9 4.0 -  10.5 K/uL   RBC 4.05 3.87 - 5.11 MIL/uL   Hemoglobin 11.5 (L) 12.0 - 15.0 g/dL   HCT 86.5 78.4 - 69.6 %   MCV 88.9 80.0 - 100.0 fL   MCH 28.4 26.0 - 34.0 pg   MCHC 31.9 30.0 - 36.0 g/dL   RDW 29.5 28.4 - 13.2 %   Platelets 170 150 - 400 K/uL   nRBC 0.0 0.0 - 0.2 %    Comment: Performed at [facility], [address], [place], [place] [address]  hCG, quantitative, pregnancy     Status: Abnormal   Collection Time: 11/27/20 12:44 AM  Result Value Ref Range   hCG, Beta Chain, Quant, S 6 (H) <5 mIU/mL    Comment:          GEST. AGE      CONC.  (mIU/mL)   <=1 WEEK        5 - 50     2 WEEKS       50 - 500     3 WEEKS       100 - 10,000     4 WEEKS     1,000 - 30,000     5 WEEKS     3,500 - 115,000   6-8 WEEKS     12,000 - 270,000    12 WEEKS     15,000 - 220,000        FEMALE AND NON-PREGNANT FEMALE:     LESS THAN 5 mIU/mL Performed at [facility], [address], [place], [place] [address]   ABO/Rh     Status: None   Collection Time: 11/27/20 12:44 AM  Result Value Ref Range   ABO/RH(D) A POS    No rh immune globuloin      NOT A RH IMMUNE GLOBULIN CANDIDATE, PT RH POSITIVE Performed at [facility], [address] [place], [place] [address]      IMAGING No results found.  MAU Management/MDM: Ordered usual first trimester r/o ectopic labs.   Pelvic exam and  cultures done Will not check baseline Ultrasound due to very low HCG level. .   This bleeding/pain can represent a normal pregnancy with bleeding, spontaneous abortion or even an ectopic which can be life-threatening.  The process as listed above helps to determine which of these is present.  Discussed drop in HCG reflects a miscarriage.   There is no need to repeat HCG levels.  Recommend followup appt as scheduled to discuss future plans   ASSESSMENT PRegnancy at [redacted]w[redacted]d Spontaneous abortion  PLAN Discharge home Advised to call office to change appt to post SAB appointment.  Pt stable at time of discharge. Encouraged to return here if she develops worsening of symptoms, increase in pain, fever, or other concerning symptoms.    Wynelle Bourgeois CNM, MSN Certified Nurse-Midwife 11/27/2020  1:03 AM

## 2020-11-27 NOTE — Progress Notes (Signed)
PT d/c home by [facility]

## 2021-01-31 DIAGNOSIS — Z3491 Encounter for supervision of normal pregnancy, unspecified, first trimester: Secondary | ICD-10-CM | POA: Diagnosis not present

## 2021-02-01 ENCOUNTER — Encounter (HOSPITAL_COMMUNITY): Payer: Self-pay | Admitting: Obstetrics and Gynecology

## 2021-02-01 ENCOUNTER — Other Ambulatory Visit: Payer: Self-pay

## 2021-02-01 ENCOUNTER — Inpatient Hospital Stay (HOSPITAL_COMMUNITY)
Admission: AD | Admit: 2021-02-01 | Discharge: 2021-02-01 | Disposition: A | Discharge status: Home / Self Care | Payer: Medicaid Other | Attending: Obstetrics and Gynecology | Admitting: Obstetrics and Gynecology

## 2021-02-01 DIAGNOSIS — F1290 Cannabis use, unspecified, uncomplicated: Secondary | ICD-10-CM

## 2021-02-01 DIAGNOSIS — R824 Acetonuria: Secondary | ICD-10-CM | POA: Diagnosis not present

## 2021-02-01 DIAGNOSIS — R5383 Other fatigue: Secondary | ICD-10-CM | POA: Insufficient documentation

## 2021-02-01 DIAGNOSIS — O219 Vomiting of pregnancy, unspecified: Secondary | ICD-10-CM | POA: Diagnosis not present

## 2021-02-01 DIAGNOSIS — Z3A01 Less than 8 weeks gestation of pregnancy: Secondary | ICD-10-CM | POA: Insufficient documentation

## 2021-02-01 LAB — RAPID URINE DRUG SCREEN, HOSP PERFORMED
Amphetamines: NOT DETECTED
Barbiturates: NOT DETECTED
Benzodiazepines: NOT DETECTED
Cocaine: NOT DETECTED
Opiates: NOT DETECTED
Tetrahydrocannabinol: POSITIVE — AB

## 2021-02-01 LAB — URINALYSIS, ROUTINE W REFLEX MICROSCOPIC
Bilirubin Urine: NEGATIVE
Glucose, UA: NEGATIVE mg/dL
Hgb urine dipstick: NEGATIVE
Ketones, ur: 20 mg/dL — AB
Leukocytes,Ua: NEGATIVE
Nitrite: NEGATIVE
Protein, ur: NEGATIVE mg/dL
Specific Gravity, Urine: 1.012 (ref 1.005–1.030)
pH: 6 (ref 5.0–8.0)

## 2021-02-01 MED ORDER — SCOPOLAMINE 1 MG/3DAYS TD PT72
1.0000 | MEDICATED_PATCH | TRANSDERMAL | Status: DC
  Administered 2021-02-01: 1.5 mg via TRANSDERMAL
  Filled 2021-02-01: qty 1

## 2021-02-01 MED ORDER — SCOPOLAMINE 1 MG/3DAYS TD PT72: 1.0000 | MEDICATED_PATCH | TRANSDERMAL | 12 refills | Status: DC

## 2021-02-01 MED ORDER — PROMETHAZINE HCL 25 MG PO TABS: 25.0000 mg | ORAL_TABLET | Freq: Four times a day (QID) | ORAL | 0 refills | Status: DC | PRN

## 2021-02-01 MED ORDER — ONDANSETRON 4 MG PO TBDP
4.0000 mg | ORAL_TABLET | Freq: Once | ORAL | Status: AC
  Administered 2021-02-01: 4 mg via ORAL
  Filled 2021-02-01: qty 1

## 2021-02-01 MED ORDER — PROMETHAZINE HCL 25 MG PO TABS
25.0000 mg | ORAL_TABLET | Freq: Once | ORAL | Status: AC
  Administered 2021-02-01: 25 mg via ORAL
  Filled 2021-02-01: qty 1

## 2021-02-01 NOTE — MAU Note (Signed)
Kim Callahan is a 20 y.o. at [redacted]w[redacted]d here in MAU reporting: for the past 6 days has been unable to keep anything down. 7 episodes of vomiting in the past 24 hours. No pain, bleeding, or discharge.   LMP: 12/14/2020  Onset of complaint: ongoing  Pain score: 0/10  Vitals:   02/01/21 1759  BP: 120/67  Pulse: 64  Resp: 16  Temp: 98.8 F (37.1 C)  SpO2: 100%     Lab orders placed from triage: UA

## 2021-02-01 NOTE — Discharge Instructions (Signed)
Safe Medications in Pregnancy   Acne: Benzoyl Peroxide Salicylic Acid  Backache/Headache: Tylenol: 2 regular strength every 4 hours OR              2 Extra strength every 6 hours  Colds/Coughs/Allergies: Benadryl (alcohol free) 25 mg every 6 hours as needed Breath right strips Claritin Cepacol throat lozenges Chloraseptic throat spray Cold-Eeze- up to three times per day Cough drops, alcohol free Flonase (by prescription only) Guaifenesin Mucinex Robitussin DM (plain only, alcohol free) Saline nasal spray/drops Sudafed (pseudoephedrine) & Actifed ** use only after [redacted] weeks gestation and if you do not have high blood pressure Tylenol Vicks Vaporub Zinc lozenges Zyrtec   Constipation: Colace Ducolax suppositories Fleet enema Glycerin suppositories Metamucil Milk of magnesia Miralax Senokot Smooth move tea  Diarrhea: Kaopectate Imodium A-D  *NO pepto Bismol  Hemorrhoids: Anusol Anusol HC Preparation H Tucks  Indigestion: Tums Maalox Mylanta Zantac  Pepcid  Insomnia: Benadryl (alcohol free) 25mg  every 6 hours as needed Tylenol PM Unisom, no Gelcaps  Leg Cramps: Tums MagGel  Nausea/Vomiting:  Bonine Dramamine Emetrol Ginger extract Sea bands Meclizine  Nausea medication to take during pregnancy:  Unisom (doxylamine succinate 25 mg tablets) Take one tablet daily at bedtime. If symptoms are not adequately controlled, the dose can be increased to a maximum recommended dose of two tablets daily (1/2 tablet in the morning, 1/2 tablet mid-afternoon and one at bedtime). Vitamin B6 100mg  tablets. Take one tablet twice a day (up to 200 mg per day).  Skin Rashes: Aveeno products Benadryl cream or 25mg  every 6 hours as needed Calamine Lotion 1% cortisone cream  Yeast infection: Gyne-lotrimin 7 Monistat 7   **If taking multiple medications, please check labels to avoid duplicating the same active ingredients **take medication as directed on  the label ** Do not exceed 4000 mg of tylenol in 24 hours **Do not take medications that contain aspirin or ibuprofen   Vomiting in First Trimester Follow these instructions at home: To help relieve your symptoms, listen to your body. Everyone is different and has different preferences. Find what works best for you. Here are some things you can try to help relieve your symptoms: Meals and snacks Eat 5-6 small meals daily instead of 3 large meals. Eating small meals and snacks can help you avoid an empty stomach. Before getting out of bed, eat a couple of crackers to avoid moving around on an empty stomach. Eat a protein-rich snack before bed. Examples include cheese and crackers, or a peanut butter sandwich made with 1 slice of whole-wheat bread and 1 tsp (5 g) of peanut butter. Eat and drink slowly. Try eating starchy foods as these are usually tolerated well. Examples include cereal, toast, bread, potatoes, pasta, rice, and pretzels. Eat at least one serving of protein with your meals and snacks. Protein options include lean meats, poultry, seafood, beans, nuts, nut butters, eggs, cheese, and yogurt. Eat or suck on things that have ginger in them. It may help to relieve nausea. Add  tsp (0.44 g) ground ginger to hot tea, or choose ginger tea.   Fluids It is important to stay hydrated. Try to: Drink small amounts of fluids often. Drink fluids 30 minutes before or after a meal to help lessen the feeling of a full stomach. Drink 100% fruit juice or an electrolyte drink. An electrolyte drink contains sodium, potassium, and chloride. Drink fluids that are cold, clear, and carbonated or sour. These include lemonade, ginger ale, lemon-lime soda, ice water, and sparkling  water. Things to avoid Avoid the following: Eating foods that trigger your symptoms. These may include spicy foods, coffee, high-fat foods, very sweet foods, and acidic foods. Drinking more than 1 cup of fluid at a time. Skipping  meals. Nausea can be more intense on an empty stomach. If you cannot tolerate food, do not force it. Try sucking on ice chips or other frozen items and make up for missed calories later. Lying down within 2 hours after eating. Being exposed to environmental triggers. These may include food smells, smoky rooms, closed spaces, rooms with strong smells, warm or humid places, overly loud and noisy rooms, and rooms with motion or flickering lights. Try eating meals in a well-ventilated area that is free of strong smells. Making quick and sudden changes in your movement. Taking iron pills and multivitamins that contain iron. If you take prescription iron pills, do not stop taking them unless your health care provider approves. Preparing food. The smell of food can spoil your appetite or trigger nausea. General instructions Brush your teeth or use a mouth rinse after meals. Take over-the-counter and prescription medicines only as told by your health care provider. Follow instructions from your health care provider about eating or drinking restrictions. Talk with your health care provider about starting a supplement of vitamin B6. Continue to take your prenatal vitamins as told by your health care provider. If you are having trouble taking your prenatal vitamins, talk with your health care provider about other options. Keep all follow-up visits. This is important. Follow-up visits include prenatal visits. Contact a health care provider if: You have pain in your abdomen. You have a severe headache. You have vision problems. You are losing weight. You feel weak or dizzy. You cannot eat or drink without vomiting, especially if this goes on for a full day. Get help right away if: You cannot drink fluids without vomiting. You vomit blood. You have constant nausea and vomiting. You are very weak. You faint. You have a fever and your symptoms suddenly get worse. Summary Making some changes to your  eating habits may help relieve nausea and vomiting. This condition may be managed with lifestyle changes and medicines as prescribed by your health care provider. If medicines do not help relieve nausea and vomiting, you may need to receive fluids through an IV at the hospital. This information is not intended to replace advice given to you by your health care provider. Make sure you discuss any questions you have with your health care provider. Document Revised: 10/02/2019 Document Reviewed: 10/02/2019 Elsevier Patient Education  2021 [employer].

## 2021-02-01 NOTE — MAU Provider Note (Signed)
History     CSN: [id number]  Arrival date and time: 02/01/21 1744  Event Date/Time  First Provider Initiated Contact with Patient 02/01/21 1815     Chief Complaint  Patient presents with   Nausea   Emesis   HPI Kim Callahan is a G2P0000 in early pregnancy who presents to MAU with chief complaint of vomiting. This is a new problem, onset six days ago. She estimates that she has vomited seven times in the past 24 hours. Patient has a prescription for Diclegis from August but states it does not work. She denies dizziness, weakness, syncope.   OB History     Gravida  2   Para  0   Term  0   Preterm  0   AB  0   Living  0      SAB  0   IAB  0   Ectopic  0   Multiple  0   Live Births  0           Past Medical History:  Diagnosis Date   Medical history non-contributory     Past Surgical History:  Procedure Laterality Date   NO PAST SURGERIES      Family History  Problem Relation Age of Onset   Hypertension Mother    Asthma Mother     Social History   Tobacco Use   Smoking status: Never   Smokeless tobacco: Never  Vaping Use   Vaping Use: Never used  Substance Use Topics   Alcohol use: No   Drug use: No    Allergies:  Allergies  Allergen Reactions   Medroxyprogesterone Itching and Rash    Medications Prior to Admission  Medication Sig Dispense Refill Last Dose   albuterol (VENTOLIN HFA) 108 (90 Base) MCG/ACT inhaler Inhale 1-2 puffs into the lungs every 6 (six) hours as needed for wheezing or shortness of breath. 18 g 0    Doxylamine-Pyridoxine (DICLEGIS) 10-10 MG TBEC Take 1-2 tablets by mouth at bedtime as needed (nausea). 60 tablet 0    fluticasone (FLONASE) 50 MCG/ACT nasal spray Place 1 spray into both nostrils daily. (Patient not taking: Reported on 08/14/2020) 16 g 2    mirtazapine (REMERON) 7.5 MG tablet Take 7.5 mg by mouth at bedtime.      Prenatal Vit-Fe Fumarate-FA (PRENATAL COMPLETE) 14-0.4 MG TABS Take 1 tablet by mouth  daily. 60 tablet 0     Review of Systems  Constitutional:  Positive for fatigue.  Gastrointestinal:  Positive for nausea and vomiting.  All other systems reviewed and are negative. Physical Exam   Blood pressure 120/67, pulse 64, temperature 98.8 F (37.1 C), temperature source Oral, resp. rate 16, height 5\' 4"  (1.626 m), weight 51.8 kg, last menstrual period 12/14/2020, SpO2 100 %, unknown if currently breastfeeding.  Physical Exam Vitals and nursing note reviewed. Exam conducted with a chaperone present.  Constitutional:      Appearance: Normal appearance. She is ill-appearing.  Cardiovascular:     Rate and Rhythm: Normal rate.     Pulses: Normal pulses.  Pulmonary:     Effort: Pulmonary effort is normal.  Skin:    Capillary Refill: Capillary refill takes less than 2 seconds.  Neurological:     Mental Status: She is alert and oriented to person, place, and time.  Psychiatric:        Mood and Affect: Mood normal.        Behavior: Behavior normal.  Thought Content: Thought content normal.        Judgment: Judgment normal.    MAU Course/MDM  Procedures  Orders Placed This Encounter  Procedures   Urinalysis, Routine w reflex microscopic Urine, Clean Catch   Rapid urine drug screen (hospital performed)   Meds ordered this encounter  Medications   ondansetron (ZOFRAN-ODT) disintegrating tablet 4 mg   promethazine (PHENERGAN) tablet 25 mg   scopolamine (TRANSDERM-SCOP) 1 MG/3DAYS 1.5 mg   scopolamine (TRANSDERM-SCOP) 1 MG/3DAYS    Sig: Place 1 patch (1.5 mg total) onto the skin every 3 (three) days.    Dispense:  10 patch    Refill:  12    Order Specific Question:   Supervising Provider    Answer:   Milas Hock [[id number]]   promethazine (PHENERGAN) 25 MG tablet    Sig: Take 1 tablet (25 mg total) by mouth every 6 (six) hours as needed for nausea or vomiting.    Dispense:  30 tablet    Refill:  0    Order Specific Question:   Supervising Provider    Answer:    Milas Hock [[id number]]   Patient Vitals for the past 24 hrs:  BP Temp Temp src Pulse Resp SpO2 Height Weight  02/01/21 1759 120/67 98.8 F (37.1 C) Oral 64 16 100 % -- --  02/01/21 1756 -- -- -- -- -- -- 5\' 4"  (1.626 m) 51.8 kg   Results for orders placed or performed during the hospital encounter of 02/01/21 (from the past 24 hour(s))  Urinalysis, Routine w reflex microscopic Urine, Clean Catch     Status: Abnormal   Collection Time: 02/01/21  5:50 PM  Result Value Ref Range   Color, Urine YELLOW YELLOW   APPearance HAZY (A) CLEAR   Specific Gravity, Urine 1.012 1.005 - 1.030   pH 6.0 5.0 - 8.0   Glucose, UA NEGATIVE NEGATIVE mg/dL   Hgb urine dipstick NEGATIVE NEGATIVE   Bilirubin Urine NEGATIVE NEGATIVE   Ketones, ur 20 (A) NEGATIVE mg/dL   Protein, ur NEGATIVE NEGATIVE mg/dL   Nitrite NEGATIVE NEGATIVE   Leukocytes,Ua NEGATIVE NEGATIVE  Rapid urine drug screen (hospital performed)     Status: Abnormal   Collection Time: 02/01/21  6:53 PM  Result Value Ref Range   Opiates NONE DETECTED NONE DETECTED   Cocaine NONE DETECTED NONE DETECTED   Benzodiazepines NONE DETECTED NONE DETECTED   Amphetamines NONE DETECTED NONE DETECTED   Tetrahydrocannabinol POSITIVE (A) NONE DETECTED   Barbiturates NONE DETECTED NONE DETECTED   Assessment and Plan  --20 y.o. G2P0000 at [redacted]w[redacted]d  --Vomiting in first trimester --Mild ketonuria. Pt given option of waiting for room for IV fluids vs discharge home --Patient requested discharge home with new medication regimen --THC + --Discharge home in stable condition  [redacted]w[redacted]d, MSA, MSN, CNM Certified Nurse Midwife, [employer] [facility] [employer], clinical, [facility]

## 2021-02-19 DIAGNOSIS — Z3481 Encounter for supervision of other normal pregnancy, first trimester: Secondary | ICD-10-CM | POA: Diagnosis not present

## 2021-02-19 DIAGNOSIS — Z369 Encounter for antenatal screening, unspecified: Secondary | ICD-10-CM | POA: Diagnosis not present

## 2021-02-19 DIAGNOSIS — O3680X0 Pregnancy with inconclusive fetal viability, not applicable or unspecified: Secondary | ICD-10-CM | POA: Diagnosis not present

## 2021-02-19 DIAGNOSIS — Z3A10 10 weeks gestation of pregnancy: Secondary | ICD-10-CM | POA: Diagnosis not present

## 2021-02-19 DIAGNOSIS — Z3A09 9 weeks gestation of pregnancy: Secondary | ICD-10-CM | POA: Diagnosis not present

## 2021-04-17 DIAGNOSIS — Z3493 Encounter for supervision of normal pregnancy, unspecified, third trimester: Secondary | ICD-10-CM | POA: Diagnosis not present

## 2021-04-23 DIAGNOSIS — Z3689 Encounter for other specified antenatal screening: Secondary | ICD-10-CM | POA: Diagnosis not present

## 2021-04-23 DIAGNOSIS — Z3A19 19 weeks gestation of pregnancy: Secondary | ICD-10-CM | POA: Diagnosis not present

## 2021-05-17 IMAGING — DX DG CHEST 2V
2 series · 2 of 2 positions shown · non-contrast
Comparison: Chest radiographs 12/08/2012.

CLINICAL DATA: 19-year-old female with productive cough for 1 week.

EXAM:
CHEST - 2 VIEW

[chest pa]
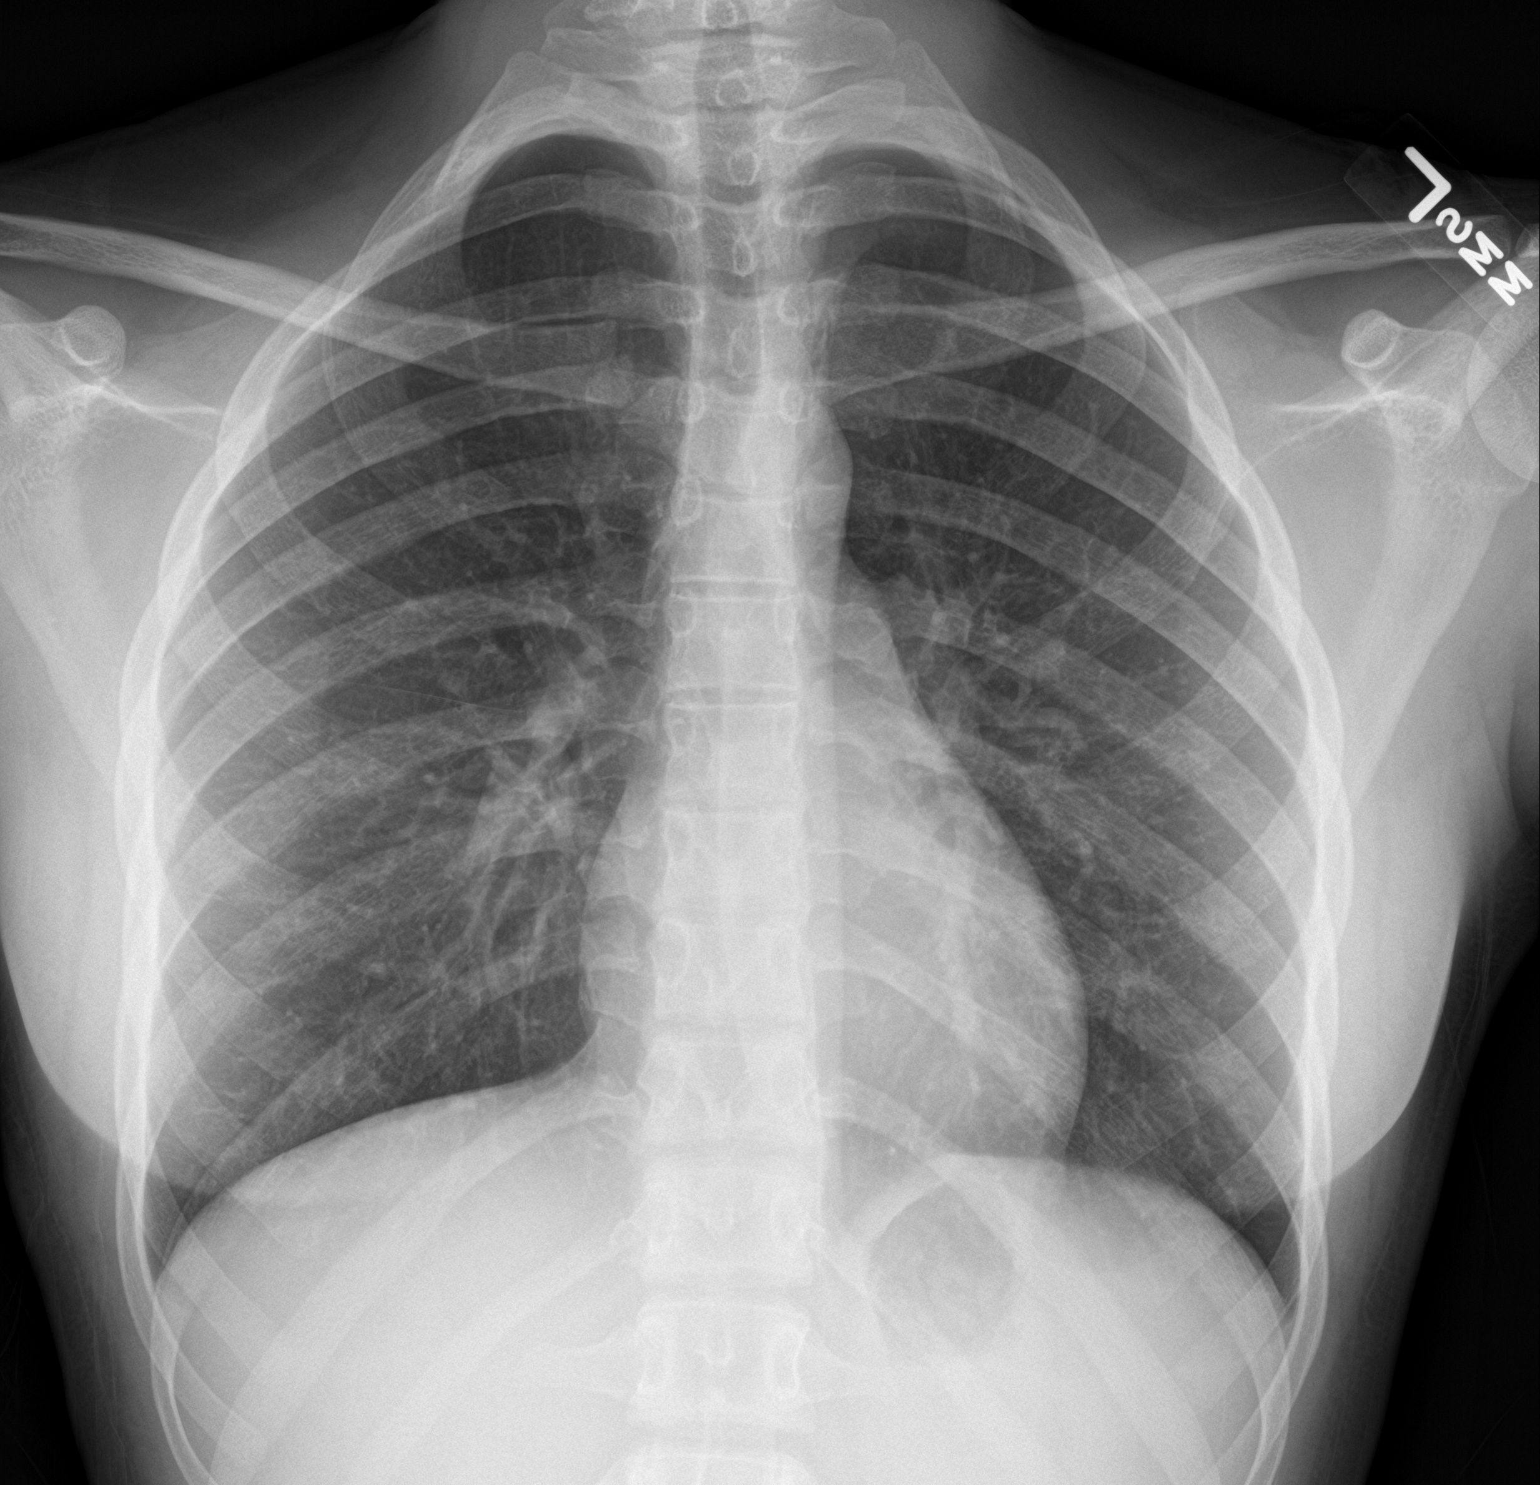

[chest lat]
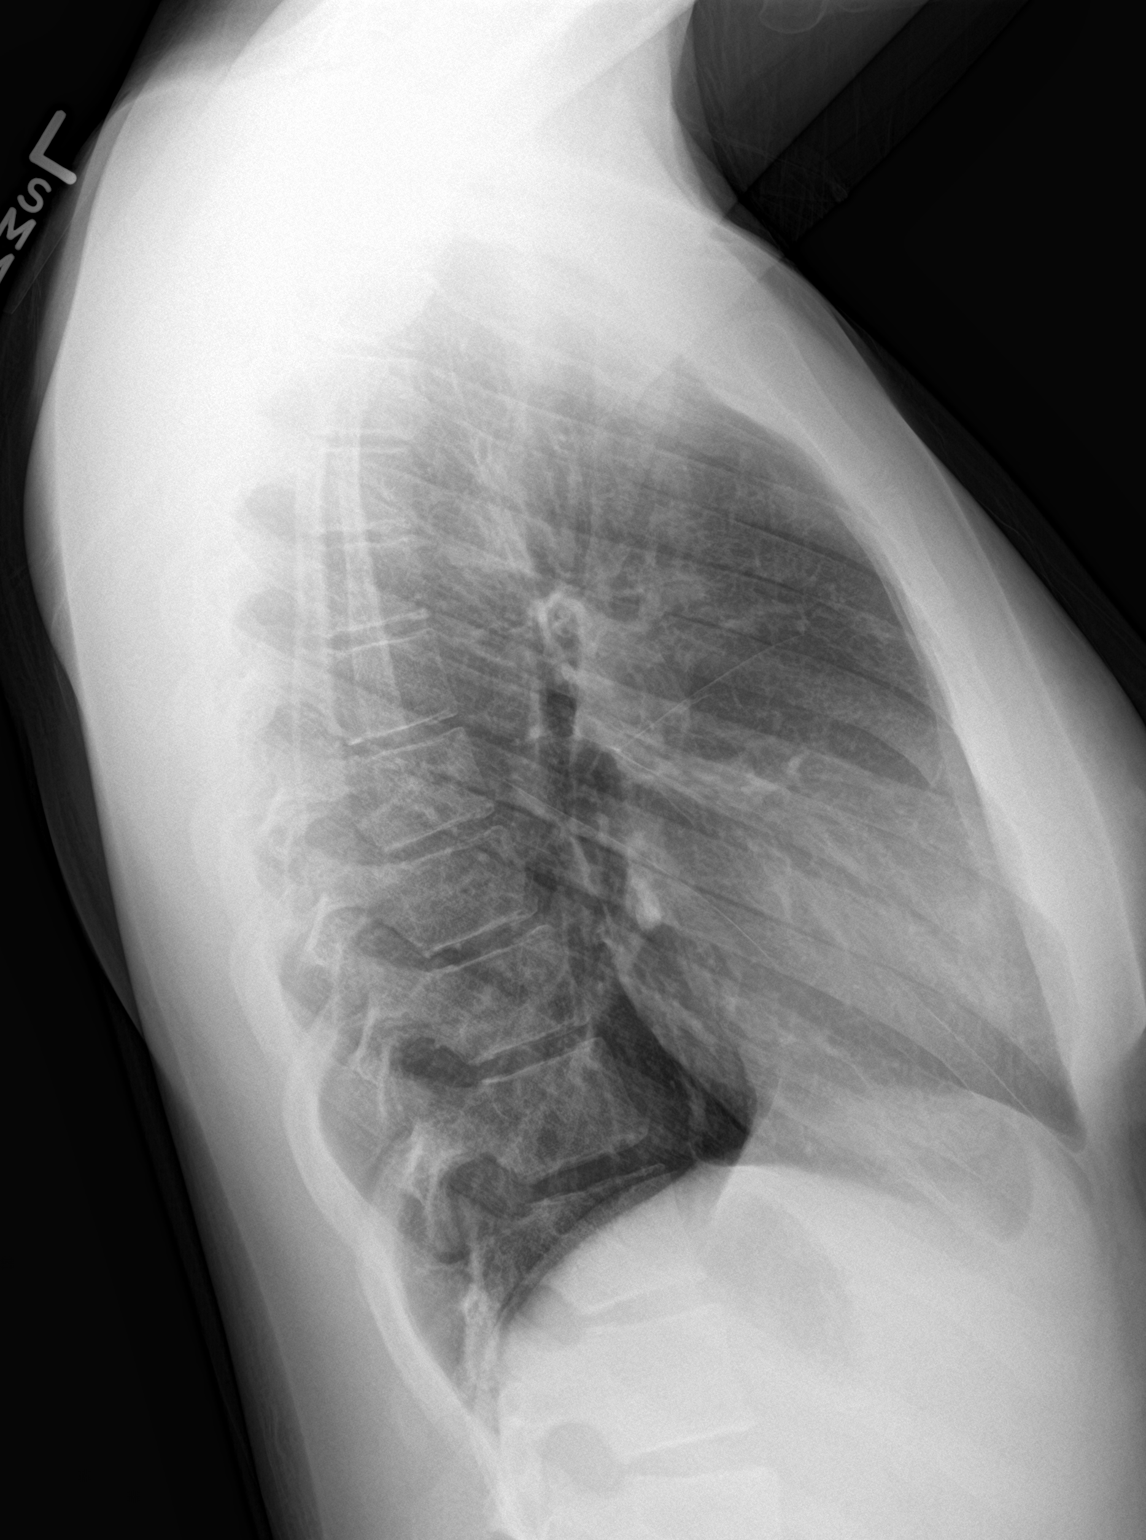

[2 of 2 positions shown; findings below may reference images not displayed]

FINDINGS: Lung volumes and mediastinal contours are normal. Visualized
tracheal air column is within normal limits. No consolidation or
pleural effusion. No pneumothorax or pulmonary edema. No convincing
increased pulmonary interstitial markings. Negative visible bowel
gas and osseous structures.
IMPRESSION: Negative.  No cardiopulmonary abnormality identified.

## 2021-06-11 DIAGNOSIS — Z3A26 26 weeks gestation of pregnancy: Secondary | ICD-10-CM | POA: Diagnosis not present

## 2021-06-11 DIAGNOSIS — O359XX0 Maternal care for (suspected) fetal abnormality and damage, unspecified, not applicable or unspecified: Secondary | ICD-10-CM | POA: Diagnosis not present

## 2021-06-12 ENCOUNTER — Encounter: Payer: Medicaid Other | Admitting: Obstetrics and Gynecology

## 2021-06-24 ENCOUNTER — Ambulatory Visit (INDEPENDENT_AMBULATORY_CARE_PROVIDER_SITE_OTHER): Payer: Medicaid Other | Admitting: Obstetrics and Gynecology

## 2021-06-24 ENCOUNTER — Ambulatory Visit (INDEPENDENT_AMBULATORY_CARE_PROVIDER_SITE_OTHER): Payer: Medicaid Other | Admitting: Licensed Clinical Social Worker

## 2021-06-24 ENCOUNTER — Encounter: Payer: Self-pay | Admitting: Obstetrics and Gynecology

## 2021-06-24 VITALS — BP 117/72 | HR 93 | Wt 129.6 lb

## 2021-06-24 DIAGNOSIS — Z3A27 27 weeks gestation of pregnancy: Secondary | ICD-10-CM

## 2021-06-24 DIAGNOSIS — Z3480 Encounter for supervision of other normal pregnancy, unspecified trimester: Secondary | ICD-10-CM | POA: Insufficient documentation

## 2021-06-24 DIAGNOSIS — D563 Thalassemia minor: Secondary | ICD-10-CM | POA: Insufficient documentation

## 2021-06-24 DIAGNOSIS — F4322 Adjustment disorder with anxiety: Secondary | ICD-10-CM

## 2021-06-24 MED ORDER — PREPLUS 27-1 MG PO TABS: 1.0000 | ORAL_TABLET | Freq: Every day | ORAL | 13 refills | Status: AC

## 2021-06-24 MED ORDER — BLOOD PRESSURE KIT DEVI: 1.0000 | 0 refills | Status: DC | PRN

## 2021-06-24 MED ORDER — GOJJI WEIGHT SCALE MISC: 1.0000 | 0 refills | Status: DC | PRN

## 2021-06-24 NOTE — Progress Notes (Signed)
NOB transfer from [facility], reports fetal movement, denies pain. ?

## 2021-06-24 NOTE — BH Specialist Note (Signed)
Integrated Behavioral Health Initial In-Person Visit ? ?MRN: [id number] ?Name: Kim Callahan ? ?Number of [place] Clinician visits: 2- Second Visit ? ?Session Start time: 1100 ?   ?Session End time: [id number] ? ?Total time in minutes: 36 ?In person at [facility]  ? ?Types of Service: Individual psychotherapy ? ?Interpretor:No. Interpretor Name and Language: none ? ? Warm Hand Off Completed. ?  ? ?  ? ? ?Subjective: ?Kim Callahan is a 21 y.o. female accompanied by Partner/Significant Other ?Patient was referred by Dr. Rip Harbour for elevated phq9 score. ?Patient reports the following symptoms/concerns: loss of interest, fatigue, and depressed mood  ?Duration of problem: approx on year; Severity of problem: mild ? ?Objective: ?Mood: good and Affect: Appropriate ?Risk of harm to self or others: No plan to harm self or others ? ?Life Context: ?Family and Social: Resides with family in [place], [place]  ?School/Work: [employer]  ?Self-Care: n/a ?Life Changes: New Pregnancy ? ?Patient and/or Family's Strengths/Protective Factors: ?Concrete supports in place (healthy food, safe environments, etc.) ? ?Goals Addressed: ?Patient will: ?Reduce symptoms of: depression ?Increase knowledge and/or ability of: coping skills  ?Demonstrate ability to: Increase adequate support systems for patient/family ? ?Progress towards Goals: ?Ongoing ? ?Interventions: ?Interventions utilized: Supportive Counseling  ?Standardized Assessments completed: PHQ 9 ? ?Patient and/or Family Response: Ms Sturdivant responded well to visit ? ?Assessment: ?Patient currently experiencing adjustment . ?  ?Patient may benefit from integrated behavioral health. ? ?Plan: ?Follow up with behavioral health clinician on : 07/08/2021 ?Behavioral recommendations: Attend scheduled appts, prioritize rest, deep breathing exercise to increase focus and mindfulness technique ?Referral(s): [place] (In Clinic) ?"From scale of 1-10, how  likely are you to follow plan?":   ? ?Lynnea Ferrier, LCSW ? ? ? ? ? ? ? ? ?

## 2021-06-24 NOTE — Patient Instructions (Signed)

## 2021-06-24 NOTE — Progress Notes (Signed)
Subjective:  ?Kim Callahan is a 21 y.o. G2P0010 at [redacted]w[redacted]d being seen today for ongoing prenatal care. Transferred from [facility]. No chronic medical problems or medications.   She is currently monitored for the following issues for this low-risk pregnancy and has Breakthrough bleeding on Nexplanon; Marijuana use; Supervision of other normal pregnancy, antepartum; and Alpha thalassemia silent carrier on their problem list. ? ?Patient reports no complaints.  Contractions: Not present. Vag. Bleeding: None.  Movement: Present. Denies leaking of fluid.  ? ?The following portions of the patient's history were reviewed and updated as appropriate: allergies, current medications, past family history, past medical history, past social history, past surgical history and problem list. Problem list updated. ? ?Objective:  ? ?Vitals:  ? 06/24/21 0917  ?BP: 117/72  ?Pulse: 93  ?Weight: 129 lb 9.6 oz (58.8 kg)  ? ? ?Fetal Status: Fetal Heart Rate (bpm): 154   Movement: Present    ? ?General:  Alert, oriented and cooperative. Patient is in no acute distress.  ?Skin: Skin is warm and dry. No rash noted.   ?Cardiovascular: Normal heart rate noted  ?Respiratory: Normal respiratory effort, no problems with respiration noted  ?Abdomen: Soft, gravid, appropriate for gestational age. Pain/Pressure: Absent     ?Pelvic:  Cervical exam deferred        ?Extremities: Normal range of motion.  Edema: Trace  ?Mental Status: Normal mood and affect. Normal behavior. Normal judgment and thought content.  ? ?Urinalysis:     ? ?Assessment and Plan:  ?Pregnancy: G2P0010 at [redacted]w[redacted]d ? ?1. Supervision of other normal pregnancy, antepartum ?Stable ?Sign for release of U/S results ?- Babyscripts Schedule Optimization ?- Glucose Tolerance, 2 Hours w/1 Hour ?- RPR ?- HIV antibody (with reflex) ?- CBC ?- Ambulatory referral to Integrated Behavioral Health ?- Prenatal Vit-Fe Fumarate-FA (PREPLUS) 27-1 MG TABS; Take 1 tablet by mouth daily.  Dispense: 30 tablet; Refill:  13 ? ?2. Alpha thalassemia silent carrier ?Has appt with genetics ? ?Preterm labor symptoms and general obstetric precautions including but not limited to vaginal bleeding, contractions, leaking of fluid and fetal movement were reviewed in detail with the patient. ?Please refer to After Visit Summary for other counseling recommendations.  ?Return in about 2 weeks (around 07/08/2021) for OB visit with midwife to discuss water birth. ? ? ?Hermina Staggers, MD ?

## 2021-06-25 LAB — CBC
Hematocrit: 34.8 % (ref 34.0–46.6)
Hemoglobin: 11.3 g/dL (ref 11.1–15.9)
MCH: 27.8 pg (ref 26.6–33.0)
MCHC: 32.5 g/dL (ref 31.5–35.7)
MCV: 86 fL (ref 79–97)
Platelets: 181 10*3/uL (ref 150–450)
RBC: 4.07 x10E6/uL (ref 3.77–5.28)
RDW: 13.1 % (ref 11.7–15.4)
WBC: 5.7 10*3/uL (ref 3.4–10.8)

## 2021-06-25 LAB — GLUCOSE TOLERANCE, 2 HOURS W/ 1HR
Glucose, 1 hour: 160 mg/dL (ref 70–179)
Glucose, 2 hour: 89 mg/dL (ref 70–152)
Glucose, Fasting: 75 mg/dL (ref 70–91)

## 2021-06-25 LAB — HIV ANTIBODY (ROUTINE TESTING W REFLEX): HIV Screen 4th Generation wRfx: NONREACTIVE

## 2021-06-25 LAB — RPR: RPR Ser Ql: NONREACTIVE

## 2021-06-27 DIAGNOSIS — D563 Thalassemia minor: Secondary | ICD-10-CM | POA: Diagnosis not present

## 2021-06-27 DIAGNOSIS — O99013 Anemia complicating pregnancy, third trimester: Secondary | ICD-10-CM | POA: Diagnosis not present

## 2021-06-27 DIAGNOSIS — Z3A28 28 weeks gestation of pregnancy: Secondary | ICD-10-CM | POA: Diagnosis not present

## 2021-07-08 ENCOUNTER — Ambulatory Visit (INDEPENDENT_AMBULATORY_CARE_PROVIDER_SITE_OTHER): Payer: Medicaid Other

## 2021-07-08 ENCOUNTER — Ambulatory Visit (INDEPENDENT_AMBULATORY_CARE_PROVIDER_SITE_OTHER): Payer: Medicaid Other | Admitting: Licensed Clinical Social Worker

## 2021-07-08 VITALS — BP 117/69 | HR 92 | Wt 132.0 lb

## 2021-07-08 DIAGNOSIS — F4322 Adjustment disorder with anxiety: Secondary | ICD-10-CM | POA: Diagnosis not present

## 2021-07-08 DIAGNOSIS — Z3A29 29 weeks gestation of pregnancy: Secondary | ICD-10-CM

## 2021-07-08 DIAGNOSIS — Z3480 Encounter for supervision of other normal pregnancy, unspecified trimester: Secondary | ICD-10-CM

## 2021-07-08 NOTE — Progress Notes (Signed)
? ?LOW-RISK PREGNANCY OFFICE VISIT ? ?Patient name: Kim Callahan MRN [id number]  Date of birth: 24-Feb-2001 ?Chief Complaint:   ?Routine Prenatal Visit ? ?Subjective:   ?Gillie Salce is a 21 y.o. G2P0010 female at [redacted]w[redacted]d with an Estimated Date of Delivery: 09/20/21 being seen today for ongoing management of a low-risk pregnancy aeb has Breakthrough bleeding on Nexplanon; Marijuana use; Supervision of other normal pregnancy, antepartum; and Alpha thalassemia silent carrier on their problem list. ? ?Patient presents today with no complaints. She does reports some swelling of her feet/ankles after work and school.  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  Patient endorses fetal movement. Patient denies abdominal cramping or contractions.  Patient denies vaginal concerns including abnormal discharge, leaking of fluid, and bleeding.  No issues with urination, constipation, or diarrhea. Patient expresses desire for WB.  ? ?Contractions: Not present. Vag. Bleeding: None.  Movement: Present. ? ?Reviewed past medical,surgical, social, obstetrical and family history as well as problem list, medications and allergies. ? ?Objective  ? ?Vitals:  ? 07/08/21 0826  ?BP: 117/69  ?Pulse: 92  ?Weight: 132 lb (59.9 kg)  ?Body mass index is 22.66 kg/m?.  ?Total Weight Gain:22 lb (9.979 kg) ? ?  ?     Physical Examination:  ? General appearance: Well appearing, and in no distress ? Mental status: Alert, oriented to person, place, and time ? Skin: Warm & dry ? Cardiovascular: Normal heart rate noted ? Respiratory: Normal respiratory effort, no distress ? Abdomen: Soft, gravid, nontender, AGA with Fundal Height: 30 cm ? Pelvic: Cervical exam deferred          ? Extremities: Edema: Trace ? ?Fetal Status: Fetal Heart Rate (bpm): 156  Movement: Present  ? ?No results found for this or any previous visit (from the past 24 hour(s)).  ?Assessment & Plan:  ?Low-risk pregnancy of a 21 y.o., G2P0010 at [redacted]w[redacted]d with an Estimated  Date of Delivery: 09/20/21  ? ?1. Supervision of other normal pregnancy, antepartum ?-Anticipatory guidance for upcoming appts. ?-Patient to schedule next appt in 2 weeks for an in-person visit. ?-Discussed desires for WB. ?-Educated on necessary courses to complete prior to consent being completed. ?-Further discussed exclusion criteria that can arise prior to or day of labor. ?-Informed that WB is a labor option and not a guarantee. ?-Patient informed that she is candidate as of today, but provider will review previous records and monitor throughout pregnancy. ?-Patient verbalizes understanding and without questions. ?-WB information included in AVS.  ? ?2. [redacted] weeks gestation of pregnancy ?-Doing well overall. ?-Discussed hydration, rest, and elevation of feet to help with edema. ?-Reassured of normalcy of edema and Precautions reviewed. ?-Encouraged to take CB course and patient reports scheduled for May 7th. ?-Plans to have FOB: [place] as support person in labor.  ? ? ? ?  ?Meds: No orders of the defined types were placed in this encounter. ? ?Labs/procedures today:  ?Lab Orders  ?No laboratory test(s) ordered today  ?  ? ?Reviewed: Preterm labor symptoms and general obstetric precautions including but not limited to vaginal bleeding, contractions, leaking of fluid and fetal movement were reviewed in detail with the patient.  All questions were answered. ? ?Follow-up: Return in about 2 weeks (around 07/22/2021) for LROB. ? ?No orders of the defined types were placed in this encounter. ? ?Cherre Robins MSN, CNM ?07/08/2021 ? ? ?Addendum ?-Review of chart shows patient with h/o syncope episode in Aug 2022.  [facility] and seizure activity ruled out.  No follow up with neurology.  Will send patient mychart message and forward chart to midwife group for further discussion. ? ?Cherre Robins MSN, CNM ?Advanced Practice Provider, [facility] ? ?

## 2021-07-08 NOTE — Progress Notes (Signed)
ROB, wants to know about Waterbirth. ?

## 2021-07-08 NOTE — BH Specialist Note (Signed)
Integrated Behavioral Health Follow Up In-Person Visit ? ?MRN: [id number] ?Name: Kim Callahan ? ?Number of Integrated Behavioral Health Clinician visits: 2- Second Visit ? ?Session Start time: 0902 ?  ?Session End time: 0920 ? ?Total time in minutes: 18 ?In person at [facility]  ? ?Types of Service: Individual psychotherapy ? ?Interpretor:No. Interpretor Name and Language: none  ? ?Subjective: ?Kim Callahan is a 21 y.o. female accompanied by n/a ?Patient was referred by Sabas Sous CNM for anxiety . ?Patient reports the following symptoms/concerns: depressed mood and fatigue  ?Duration of problem: approx one year ; Severity of problem: mild ? ?Objective: ?Mood: Anxious and Affect: Appropriate ?Risk of harm to self or others: No plan to harm self or others ? ?Life Context: ?Family and Social: Lives with family  ?School/Work: Consulting civil engineer at [employer] ?Self-Care: Rest  ?Life Changes: New pregnancy ? ?Patient and/or Family's Strengths/Protective Factors: ?Concrete supports in place (healthy food, safe environments, etc.) ? ?Goals Addressed: ?Patient will: ? Reduce symptoms of: anxiety  ? Increase knowledge and/or ability of: coping skills  ? Demonstrate ability to: Increase healthy adjustment to current life circumstances ? ?Progress towards Goals: ?Ongoing ? ?Interventions: ?Interventions utilized:  Supportive Counseling ?Standardized Assessments completed: PHQ 9 ? ?Patient and/or Family Response: Ms. Reber responded well to visit  ? ?Assessment: ?Patient currently experiencing adjustment disorder with anxiety ? ?Patient may benefit from integrated behavioral health . ? ?Plan: ?Follow up with behavioral health clinician on : 3 weeks  ?Behavioral recommendations: Take water birth class, prioritize rest, mindfulness to prevent burnout  ?Referral(s): Community Resources:  cone healthy baby website  ?"From scale of 1-10, how likely are you to follow plan?":   ? ?Gwyndolyn Saxon, LCSW ? ? ?

## 2021-07-08 NOTE — Patient Instructions (Signed)

## 2021-07-22 ENCOUNTER — Other Ambulatory Visit (HOSPITAL_COMMUNITY)
Admission: RE | Admit: 2021-07-22 | Discharge: 2021-07-22 | Disposition: A | Discharge status: Home / Self Care | Payer: Medicaid Other | Source: Ambulatory Visit | Attending: Obstetrics and Gynecology | Admitting: Obstetrics and Gynecology

## 2021-07-22 ENCOUNTER — Ambulatory Visit (INDEPENDENT_AMBULATORY_CARE_PROVIDER_SITE_OTHER): Payer: Medicaid Other | Admitting: Obstetrics and Gynecology

## 2021-07-22 ENCOUNTER — Ambulatory Visit (INDEPENDENT_AMBULATORY_CARE_PROVIDER_SITE_OTHER): Payer: Medicaid Other | Admitting: Licensed Clinical Social Worker

## 2021-07-22 VITALS — BP 118/75 | HR 106 | Wt 135.2 lb

## 2021-07-22 DIAGNOSIS — N898 Other specified noninflammatory disorders of vagina: Secondary | ICD-10-CM | POA: Insufficient documentation

## 2021-07-22 DIAGNOSIS — Z3A31 31 weeks gestation of pregnancy: Secondary | ICD-10-CM

## 2021-07-22 DIAGNOSIS — O26893 Other specified pregnancy related conditions, third trimester: Secondary | ICD-10-CM | POA: Insufficient documentation

## 2021-07-22 DIAGNOSIS — D563 Thalassemia minor: Secondary | ICD-10-CM

## 2021-07-22 DIAGNOSIS — F4322 Adjustment disorder with anxiety: Secondary | ICD-10-CM

## 2021-07-22 DIAGNOSIS — Z3480 Encounter for supervision of other normal pregnancy, unspecified trimester: Secondary | ICD-10-CM

## 2021-07-22 NOTE — Progress Notes (Signed)
? ?  PRENATAL VISIT NOTE ? ?Subjective:  ?Kim Callahan is a 21 y.o. G2P0010 at [redacted]w[redacted]d being seen today for ongoing prenatal care.  She is currently monitored for the following issues for this low-risk pregnancy and has Breakthrough bleeding on Nexplanon; Marijuana use; Supervision of other normal pregnancy, antepartum; and Alpha thalassemia silent carrier on their problem list. ? ?Patient doing well with no acute concerns today. She reports  mild vaginal discharge .  Contractions: Not present. Vag. Bleeding: None.  Movement: Present. Denies leaking of fluid.  ? ?The following portions of the patient's history were reviewed and updated as appropriate: allergies, current medications, past family history, past medical history, past social history, past surgical history and problem list. Problem list updated. ? ?Objective:  ? ?Vitals:  ? 07/22/21 1540  ?BP: 118/75  ?Pulse: (!) 106  ?Weight: 61.3 kg  ? ? ?Fetal Status: Fetal Heart Rate (bpm): 160   Movement: Present    ? ?General:  Alert, oriented and cooperative. Patient is in no acute distress.  ?Skin: Skin is warm and dry. No rash noted.   ?Cardiovascular: Normal heart rate noted  ?Respiratory: Normal respiratory effort, no problems with respiration noted  ?Abdomen: Soft, gravid, appropriate for gestational age.  Pain/Pressure: Absent     ?Pelvic: Cervical exam deferred        ?Extremities: Normal range of motion.  Edema: Trace  ?Mental Status:  Normal mood and affect. Normal behavior. Normal judgment and thought content.  ? ?Assessment and Plan:  ?Pregnancy: G2P0010 at [redacted]w[redacted]d ? ?1. [redacted] weeks gestation of pregnancy ? ? ?2. Supervision of other normal pregnancy, antepartum ?Continue routine prenatal care ? ?3. Alpha thalassemia silent carrier ?FOB is known carrier ? ?4. Vaginal discharge during pregnancy in third trimester ?Self swab for evaluation, pt notes discharge ?- Cervicovaginal ancillary only( [place]) ? ?Preterm labor symptoms and general obstetric  precautions including but not limited to vaginal bleeding, contractions, leaking of fluid and fetal movement were reviewed in detail with the patient. ? ?Please refer to After Visit Summary for other counseling recommendations.  ? ?Return in about 2 weeks (around 08/05/2021) for ROB, in person. ? ? ?Lynnda Shields, MD ?Faculty Attending ?[facility] [place] ?  ?

## 2021-07-22 NOTE — Progress Notes (Signed)
Pt reports fetal movement, denies pain.  

## 2021-07-23 LAB — CERVICOVAGINAL ANCILLARY ONLY
Bacterial Vaginitis (gardnerella): POSITIVE — AB
Candida Glabrata: NEGATIVE
Candida Vaginitis: POSITIVE — AB
Comment: NEGATIVE
Comment: NEGATIVE
Comment: NEGATIVE

## 2021-07-23 NOTE — BH Specialist Note (Deleted)
Integrated Behavioral Health Follow Up In-Person Visit ? ?MRN: [id number] ?Name: Kim Callahan ? ?Number of Integrated Behavioral Health Clinician visits: 2- Second Visit ? ?Session Start time: 0902 ?  ?Session End time: 0920 ? ?Total time in minutes: 18 ? ? ?Types of Service: {CHL AMB TYPE OF SERVICE:[phone]} ? ?Interpretor:{yes [id number]} Interpretor Name and Language: *** ? ?Subjective: ?Kim Callahan is a 21 y.o. female accompanied by {Patient accompanied by:[phone]} ?Patient was referred by *** for ***. ?Patient reports the following symptoms/concerns: *** ?Duration of problem: ***; Severity of problem: {Mild/Moderate/Severe:20260} ? ?Objective: ?Mood: {BHH MOOD:22306} and Affect: {BHH AFFECT:22307} ?Risk of harm to self or others: {CHL AMB BH Suicide Current Mental Status:21022748} ? ?Life Context: ?Family and Social: *** ?School/Work: *** ?Self-Care: *** ?Life Changes: *** ? ?Patient and/or Family's Strengths/Protective Factors: ?{CHL AMB BH PROTECTIVE FACTORS:[phone]} ? ?Goals Addressed: ?Patient will: ? Reduce symptoms of: {IBH Symptoms:21014056}  ? Increase knowledge and/or ability of: {IBH Patient Tools:21014057}  ? Demonstrate ability to: {IBH Goals:21014053} ? ?Progress towards Goals: ?{CHL AMB BH PROGRESS TOWARDS PXTGG:[id number]} ? ?Interventions: ?Interventions utilized:  {IBH Interventions:21014054} ?Standardized Assessments completed: {IBH Screening Tools:21014051} ? ?Patient and/or Family Response: *** ? ?Patient Centered Plan: ?Patient is on the following Treatment Plan(s): *** ?Assessment: ?Patient currently experiencing ***.  ? ?Patient may benefit from ***. ? ?Plan: ?Follow up with behavioral health clinician on : *** ?Behavioral recommendations: *** ?Referral(s): {IBH Referrals:21014055} ?"From scale of 1-10, how likely are you to follow plan?": *** ? ?Kim Saxon, LCSW ? ? ?

## 2021-07-23 NOTE — BH Specialist Note (Signed)
Integrated Behavioral Health Follow Up In-Person Visit ? ?MRN: [id number] ?Name: Kim Callahan ? ?Number of Integrated Behavioral Health Clinician visits: 3 ?Session Start time: 300pm   ?Session End time: 322pm ?Total time in minutes: 22 mins via phone  ? ?Types of Service: Individual psychotherapy and Telephone visit ? ?Interpretor:No. Interpretor Name and Language: none ? ?Subjective: ?Kim Callahan is a 21 y.o. female accompanied by n/a ?Patient was referred by Sabas Sous for anxiety. ?Patient reports the following symptoms/concerns: depressed mood and fatigue  ?Duration of problem: approx one year; Severity of problem: mild ? ?Objective: ?Mood: good and Affect: Appropriate ?Risk of harm to self or others: No plan to harm self or others ? ?Life Context: ?Family and Social: Lives with family in [place] ?School/Work: Consulting civil engineer ?Self-Care: Rest ?Life Changes: New Pregnancy ? ?Patient and/or Family's Strengths/Protective Factors: ?Concrete supports in place (healthy food, safe environments, etc.) ? ?Goals Addressed: ?Patient will: ? Reduce symptoms of: anxiety  ? Increase knowledge and/or ability of: coping skills  ? Demonstrate ability to: Increase healthy adjustment to current life circumstances ? ?Progress towards Goals: ?Ongoing ? ?Interventions: ?Interventions utilized:  Motivational Interviewing ?Standardized Assessments completed: Not Needed ? ?Patient and/or Family Response: Kim Callahan reports improvement in mood and utilization of coping skills. ? ?Assessment: ?Patient currently experiencing adjustment disorder with anxiety.  ? ?Patient may benefit from integrated behavioral health. ? ?Plan: ?Follow up with behavioral health clinician on : as needed  ?Behavioral recommendations: Prioritize rest, listen to music and engage in mindfulness to prevent burnout ?Referral(s): Integrated [facility] (In Clinic) ?"From scale of 1-10, how likely are you to follow plan?":  ? ?Gwyndolyn Saxon, LCSW ? ? ?

## 2021-07-24 ENCOUNTER — Other Ambulatory Visit: Payer: Self-pay | Admitting: *Deleted

## 2021-07-24 DIAGNOSIS — B3731 Acute candidiasis of vulva and vagina: Secondary | ICD-10-CM

## 2021-07-24 DIAGNOSIS — N760 Acute vaginitis: Secondary | ICD-10-CM

## 2021-07-24 MED ORDER — METRONIDAZOLE 500 MG PO TABS: 500.0000 mg | ORAL_TABLET | Freq: Two times a day (BID) | ORAL | 0 refills | Status: DC

## 2021-07-24 MED ORDER — TERCONAZOLE 0.4 % VA CREA: 1.0000 | TOPICAL_CREAM | Freq: Every day | VAGINAL | 0 refills | Status: DC

## 2021-07-24 NOTE — Progress Notes (Signed)
Flagyl and Terazol sent pre protocol for +BV and yeast. ?See lab results.  ?

## 2021-08-05 ENCOUNTER — Ambulatory Visit (INDEPENDENT_AMBULATORY_CARE_PROVIDER_SITE_OTHER): Payer: Medicaid Other | Admitting: Family Medicine

## 2021-08-05 VITALS — BP 114/78 | HR 82 | Wt 136.0 lb

## 2021-08-05 DIAGNOSIS — Z3A33 33 weeks gestation of pregnancy: Secondary | ICD-10-CM

## 2021-08-05 DIAGNOSIS — Z3480 Encounter for supervision of other normal pregnancy, unspecified trimester: Secondary | ICD-10-CM

## 2021-08-05 NOTE — Progress Notes (Signed)
?  Subjective:  ?Kim Callahan is a 21 y.o. G2P0010 at [redacted]w[redacted]d being seen today for ongoing prenatal care.  She is currently monitored for the following issues for this low-risk pregnancy and has Breakthrough bleeding on Nexplanon; Marijuana use; Supervision of other normal pregnancy, antepartum; and Alpha thalassemia silent carrier on their problem list. ? ?Patient reports  some vaginal pressure .  Contractions: Not present. Vag. Bleeding: None.  Movement: Present. Denies leaking of fluid.  ? ?The following portions of the patient's history were reviewed and updated as appropriate: allergies, current medications, past family history, past medical history, past social history, past surgical history and problem list. Problem list updated. ? ?Objective:  ? ?Vitals:  ? 08/05/21 1440  ?BP: 114/78  ?Pulse: 82  ?Weight: 136 lb (61.7 kg)  ? ? ?Fetal Status:     Movement: Present    ? ?General:  Alert, oriented and cooperative. Patient is in no acute distress.  ?Skin: Skin is warm and dry. No rash noted.   ?Cardiovascular: Normal heart rate noted  ?Respiratory: Normal respiratory effort, no problems with respiration noted  ?Abdomen: Soft, gravid, appropriate for gestational age. Pain/Pressure: Absent     ?Pelvic: Vag. Bleeding: None     ?Cervical exam deferred        ?Extremities: Normal range of motion.  Edema: Trace  ?Mental Status: Normal mood and affect. Normal behavior. Normal judgment and thought content.  ? ? ?Assessment and Plan:  ?Pregnancy: G2P0010 at [redacted]w[redacted]d ? ?1. Supervision of other normal pregnancy, antepartum ?Doing well. No concerns at this time. Denies OB problems. [place] was done at [facility]. On careeverywhere actual read not available but can see that she had it done. Patient filled out release form today ?- follow up in 2 weeks ? ?2. [redacted] weeks gestation of pregnancy ? ? ?4. Contraception counseling ?Considering IUD. Considering liletta. Not sure if wants post placental or at 6 week visit ? ?Preterm labor symptoms  and general obstetric precautions including but not limited to vaginal bleeding, contractions, leaking of fluid and fetal movement were reviewed in detail with the patient. ?Please refer to After Visit Summary for other counseling recommendations.  ?Return in about 2 weeks (around 08/19/2021) for LROB any provider. ? ? ?Warner Mccreedy, MD, MPH ?OB Fellow, Faculty Practice ? ? ?

## 2021-08-13 ENCOUNTER — Telehealth: Payer: Self-pay

## 2021-08-13 NOTE — Telephone Encounter (Signed)
Patient called stating that she got a tooth filled on 5/18 and she is still having a lot of pain in that tooth. Patient states that she is taking tylenol for the pain with no relief. Patient states that she was told to follow up after a week if needed. I advised patient to contact her dentist office to discuss continued pain.

## 2021-08-19 ENCOUNTER — Ambulatory Visit (INDEPENDENT_AMBULATORY_CARE_PROVIDER_SITE_OTHER): Payer: Medicaid Other | Admitting: Obstetrics & Gynecology

## 2021-08-19 DIAGNOSIS — Z3A35 35 weeks gestation of pregnancy: Secondary | ICD-10-CM

## 2021-08-19 DIAGNOSIS — Z3480 Encounter for supervision of other normal pregnancy, unspecified trimester: Secondary | ICD-10-CM

## 2021-08-19 NOTE — Progress Notes (Signed)
Pt reports fetal movement with occasional pressure.  

## 2021-08-19 NOTE — Progress Notes (Signed)
   PRENATAL VISIT NOTE  Subjective:  Kim Callahan is a 21 y.o. G2P0010 at [redacted]w[redacted]d being seen today for ongoing prenatal care.  She is currently monitored for the following issues for this low-risk pregnancy and has Marijuana use; Supervision of other normal pregnancy, antepartum; and Alpha thalassemia silent carrier on their problem list.  Patient reports no complaints.  Contractions: Not present. Vag. Bleeding: None.  Movement: Present. Denies leaking of fluid.   The following portions of the patient's history were reviewed and updated as appropriate: allergies, current medications, past family history, past medical history, past social history, past surgical history and problem list.   Objective:   Vitals:   08/19/21 1137  BP: 123/80  Pulse: 89  Weight: 136 lb (61.7 kg)    Fetal Status: Fetal Heart Rate (bpm): 153   Movement: Present     General:  Alert, oriented and cooperative. Patient is in no acute distress.  Skin: Skin is warm and dry. No rash noted.   Cardiovascular: Normal heart rate noted  Respiratory: Normal respiratory effort, no problems with respiration noted  Abdomen: Soft, gravid, appropriate for gestational age.  Pain/Pressure: Absent     Pelvic: Cervical exam deferred        Extremities: Normal range of motion.  Edema: Trace  Mental Status: Normal mood and affect. Normal behavior. Normal judgment and thought content.   Assessment and Plan:  Pregnancy: G2P0010 at [redacted]w[redacted]d 1. Supervision of other normal pregnancy, antepartum No [place] measurements available - [place] MFM OB COMP + 14 WK; Future  Preterm labor symptoms and general obstetric precautions including but not limited to vaginal bleeding, contractions, leaking of fluid and fetal movement were reviewed in detail with the patient. Please refer to After Visit Summary for other counseling recommendations.   Return in about 1 week (around 08/26/2021).  No future appointments.  Scheryl Darter, MD

## 2021-08-27 ENCOUNTER — Ambulatory Visit (INDEPENDENT_AMBULATORY_CARE_PROVIDER_SITE_OTHER): Payer: Medicaid Other | Admitting: Obstetrics & Gynecology

## 2021-08-27 ENCOUNTER — Other Ambulatory Visit (HOSPITAL_COMMUNITY)
Admission: RE | Admit: 2021-08-27 | Discharge: 2021-08-27 | Disposition: A | Discharge status: Home / Self Care | Payer: Medicaid Other | Source: Ambulatory Visit | Attending: Obstetrics & Gynecology | Admitting: Obstetrics & Gynecology

## 2021-08-27 VITALS — BP 120/75 | HR 98 | Wt 141.0 lb

## 2021-08-27 DIAGNOSIS — Z3480 Encounter for supervision of other normal pregnancy, unspecified trimester: Secondary | ICD-10-CM

## 2021-08-27 DIAGNOSIS — D563 Thalassemia minor: Secondary | ICD-10-CM

## 2021-08-27 NOTE — Progress Notes (Signed)
   PRENATAL VISIT NOTE  Subjective:  Kim Callahan is a 21 y.o. G2P0010 at [redacted]w[redacted]d being seen today for ongoing prenatal care.  She is currently monitored for the following issues for this low-risk pregnancy and has Marijuana use; Supervision of other normal pregnancy, antepartum; and Alpha thalassemia silent carrier on their problem list.  Patient reports no complaints.  Contractions: Not present. Vag. Bleeding: None.  Movement: Present. Denies leaking of fluid.   The following portions of the patient's history were reviewed and updated as appropriate: allergies, current medications, past family history, past medical history, past social history, past surgical history and problem list.   Objective:   Vitals:   08/27/21 1444  BP: 120/75  Pulse: 98  Weight: 141 lb (64 kg)    Fetal Status: Fetal Heart Rate (bpm): 140   Movement: Present  Presentation: Vertex  General:  Alert, oriented and cooperative. Patient is in no acute distress.  Skin: Skin is warm and dry. No rash noted.   Cardiovascular: Normal heart rate noted  Respiratory: Normal respiratory effort, no problems with respiration noted  Abdomen: Soft, gravid, appropriate for gestational age.  Pain/Pressure: Present     Pelvic: Cervical exam performed in the presence of a chaperone Dilation: 1 Effacement (%): 40 Station: -3  Extremities: Normal range of motion.     Mental Status: Normal mood and affect. Normal behavior. Normal judgment and thought content.   Assessment and Plan:  Pregnancy: G2P0010 at [redacted]w[redacted]d 1. Supervision of other normal pregnancy, antepartum 36 weeks - Cervicovaginal ancillary only( [place]) - Culture, beta strep (group b only) - Hepatitis C antibody  2. Alpha thalassemia silent carrier Had genetic counseling  Preterm labor symptoms and general obstetric precautions including but not limited to vaginal bleeding, contractions, leaking of fluid and fetal movement were reviewed in detail with the  patient. Please refer to After Visit Summary for other counseling recommendations.   Return in about 1 week (around 09/03/2021).  Future Appointments  Date Time Provider Department Center  09/17/2021  1:30 PM [facility] NURSE [facility]  09/17/2021  1:45 PM [facility]    Scheryl Darter, MD

## 2021-08-28 LAB — CERVICOVAGINAL ANCILLARY ONLY
Chlamydia: NEGATIVE
Comment: NEGATIVE
Comment: NORMAL
Neisseria Gonorrhea: NEGATIVE

## 2021-08-28 LAB — HEPATITIS C ANTIBODY: Hep C Virus Ab: NONREACTIVE

## 2021-08-31 LAB — CULTURE, BETA STREP (GROUP B ONLY): Strep Gp B Culture: NEGATIVE

## 2021-09-02 ENCOUNTER — Ambulatory Visit (INDEPENDENT_AMBULATORY_CARE_PROVIDER_SITE_OTHER): Payer: Medicaid Other | Admitting: Obstetrics

## 2021-09-02 ENCOUNTER — Encounter: Payer: Self-pay | Admitting: Obstetrics

## 2021-09-02 VITALS — BP 128/84 | HR 91 | Wt 143.4 lb

## 2021-09-02 DIAGNOSIS — Z3480 Encounter for supervision of other normal pregnancy, unspecified trimester: Secondary | ICD-10-CM

## 2021-09-02 DIAGNOSIS — B379 Candidiasis, unspecified: Secondary | ICD-10-CM

## 2021-09-02 MED ORDER — TERCONAZOLE 0.8 % VA CREA: 1.0000 | TOPICAL_CREAM | Freq: Every day | VAGINAL | 0 refills | Status: DC

## 2021-09-02 NOTE — Progress Notes (Signed)
Subjective:  Kim Callahan is a 21 y.o. G2P0010 at [redacted]w[redacted]d being seen today for ongoing prenatal care.  She is currently monitored for the following issues for this low-risk pregnancy and has Marijuana use; Supervision of other normal pregnancy, antepartum; and Alpha thalassemia silent carrier on their problem list.  Patient reports no complaints.  Contractions: Not present. Vag. Bleeding: None.  Movement: Present. Denies leaking of fluid.   The following portions of the patient's history were reviewed and updated as appropriate: allergies, current medications, past family history, past medical history, past social history, past surgical history and problem list. Problem list updated.  Objective:   Vitals:   09/02/21 1055  BP: 128/84  Pulse: 91  Weight: 143 lb 6.4 oz (65 kg)    Fetal Status: Fetal Heart Rate (bpm): 152   Movement: Present     General:  Alert, oriented and cooperative. Patient is in no acute distress.  Skin: Skin is warm and dry. No rash noted.   Cardiovascular: Normal heart rate noted  Respiratory: Normal respiratory effort, no problems with respiration noted  Abdomen: Soft, gravid, appropriate for gestational age. Pain/Pressure: Present     Pelvic:  Cervical exam deferred        Extremities: Normal range of motion.  Edema: Trace  Mental Status: Normal mood and affect. Normal behavior. Normal judgment and thought content.   Urinalysis:      Assessment and Plan:  Pregnancy: G2P0010 at [redacted]w[redacted]d  1. Supervision of other normal pregnancy, antepartum  2. Yeast infection Rx: - terconazole (TERAZOL 3) 0.8 % vaginal cream; Place 1 applicator vaginally at bedtime. Apply nightly for three nights.  Dispense: 20 g; Refill: 0    Term labor symptoms and general obstetric precautions including but not limited to vaginal bleeding, contractions, leaking of fluid and fetal movement were reviewed in detail with the patient. Please refer to After Visit Summary for other counseling  recommendations.   Return in about 1 week (around 09/09/2021) for [place].   Shelly Bombard, MD  09/02/21

## 2021-09-02 NOTE — Progress Notes (Signed)
Patient presents for ROB. Patient states that she feels like she has a yeast infection after completing antibiotic. Terconazole sent to the pharmacy. No other concerns.

## 2021-09-08 ENCOUNTER — Telehealth: Payer: Self-pay

## 2021-09-08 NOTE — Telephone Encounter (Signed)
Patient complains of having some cramping around 4 this am morning that lasted for about 3 hours. Patient states that she the cramping resolved. Denies having any leakage of fluid, bleeding, and states that she is feeling baby move well. Dicussed contraction patterns, timing, and pain with patient. Patient also advised to be evaluated if she begins to have LOF, bleeding, or decreased fetal movement. Patient verbalized understanding.

## 2021-09-12 ENCOUNTER — Encounter: Payer: Self-pay | Admitting: *Deleted

## 2021-09-12 ENCOUNTER — Ambulatory Visit (INDEPENDENT_AMBULATORY_CARE_PROVIDER_SITE_OTHER): Payer: Medicaid Other | Admitting: Obstetrics and Gynecology

## 2021-09-12 ENCOUNTER — Encounter: Payer: Self-pay | Admitting: Obstetrics and Gynecology

## 2021-09-12 ENCOUNTER — Encounter (HOSPITAL_COMMUNITY): Payer: Self-pay | Admitting: *Deleted

## 2021-09-12 ENCOUNTER — Telehealth (HOSPITAL_COMMUNITY): Payer: Self-pay | Admitting: *Deleted

## 2021-09-12 VITALS — BP 125/86 | HR 99 | Wt 147.5 lb

## 2021-09-12 DIAGNOSIS — Z3490 Encounter for supervision of normal pregnancy, unspecified, unspecified trimester: Secondary | ICD-10-CM

## 2021-09-12 DIAGNOSIS — Z3480 Encounter for supervision of other normal pregnancy, unspecified trimester: Secondary | ICD-10-CM

## 2021-09-12 DIAGNOSIS — D563 Thalassemia minor: Secondary | ICD-10-CM

## 2021-09-12 DIAGNOSIS — Z3A39 39 weeks gestation of pregnancy: Secondary | ICD-10-CM

## 2021-09-15 ENCOUNTER — Other Ambulatory Visit: Payer: Self-pay

## 2021-09-15 ENCOUNTER — Inpatient Hospital Stay (HOSPITAL_COMMUNITY)
Admission: AD | Admit: 2021-09-15 | Discharge: 2021-09-17 | DRG: 806 | Disposition: A | Discharge status: Home / Self Care | Payer: Medicaid Other | Attending: Obstetrics & Gynecology | Admitting: Obstetrics & Gynecology

## 2021-09-15 ENCOUNTER — Encounter (HOSPITAL_COMMUNITY): Payer: Self-pay | Admitting: Obstetrics & Gynecology

## 2021-09-15 DIAGNOSIS — O99324 Drug use complicating childbirth: Secondary | ICD-10-CM | POA: Diagnosis not present

## 2021-09-15 DIAGNOSIS — F1290 Cannabis use, unspecified, uncomplicated: Secondary | ICD-10-CM | POA: Diagnosis present

## 2021-09-15 DIAGNOSIS — Z148 Genetic carrier of other disease: Secondary | ICD-10-CM

## 2021-09-15 DIAGNOSIS — Z3A39 39 weeks gestation of pregnancy: Secondary | ICD-10-CM

## 2021-09-15 DIAGNOSIS — D563 Thalassemia minor: Secondary | ICD-10-CM | POA: Diagnosis present

## 2021-09-15 DIAGNOSIS — O26893 Other specified pregnancy related conditions, third trimester: Secondary | ICD-10-CM | POA: Diagnosis present

## 2021-09-15 HISTORY — DX: Thalassemia minor: D56.3

## 2021-09-15 LAB — TYPE AND SCREEN
ABO/RH(D): A POS
Antibody Screen: NEGATIVE

## 2021-09-15 LAB — OB RESULTS CONSOLE ABO/RH: RH Type: POSITIVE

## 2021-09-15 LAB — CBC
HCT: 36.2 % (ref 36.0–46.0)
Hemoglobin: 12.1 g/dL (ref 12.0–15.0)
MCH: 28.9 pg (ref 26.0–34.0)
MCHC: 33.4 g/dL (ref 30.0–36.0)
MCV: 86.4 fL (ref 80.0–100.0)
Platelets: 182 10*3/uL (ref 150–400)
RBC: 4.19 MIL/uL (ref 3.87–5.11)
RDW: 13 % (ref 11.5–15.5)
WBC: 6.8 10*3/uL (ref 4.0–10.5)
nRBC: 0 % (ref 0.0–0.2)

## 2021-09-15 LAB — OB RESULTS CONSOLE ANTIBODY SCREEN: Antibody Screen: NEGATIVE

## 2021-09-15 LAB — RPR: RPR Ser Ql: NONREACTIVE

## 2021-09-15 LAB — OB RESULTS CONSOLE RUBELLA ANTIBODY, IGM: Rubella: IMMUNE

## 2021-09-15 LAB — HEPATITIS C ANTIBODY: HCV Ab: NEGATIVE

## 2021-09-15 LAB — OB RESULTS CONSOLE HIV ANTIBODY (ROUTINE TESTING): HIV: NONREACTIVE

## 2021-09-15 LAB — OB RESULTS CONSOLE HEPATITIS B SURFACE ANTIGEN: Hepatitis B Surface Ag: NEGATIVE

## 2021-09-15 MED ORDER — MISOPROSTOL 200 MCG PO TABS
800.0000 ug | ORAL_TABLET | Freq: Once | ORAL | Status: AC
  Administered 2021-09-15: 800 ug via BUCCAL

## 2021-09-15 MED ORDER — SENNOSIDES-DOCUSATE SODIUM 8.6-50 MG PO TABS
2.0000 | ORAL_TABLET | ORAL | Status: DC
  Administered 2021-09-17: 2 via ORAL
  Filled 2021-09-15 (×2): qty 2

## 2021-09-15 MED ORDER — ONDANSETRON HCL 4 MG PO TABS: 4.0000 mg | ORAL_TABLET | ORAL | Status: DC | PRN

## 2021-09-15 MED ORDER — LACTATED RINGERS IV SOLN: INTRAVENOUS | Status: DC

## 2021-09-15 MED ORDER — IBUPROFEN 600 MG PO TABS
600.0000 mg | ORAL_TABLET | Freq: Four times a day (QID) | ORAL | Status: DC
  Administered 2021-09-15 – 2021-09-17 (×8): 600 mg via ORAL
  Filled 2021-09-15 (×8): qty 1

## 2021-09-15 MED ORDER — ONDANSETRON HCL 4 MG/2ML IJ SOLN: 4.0000 mg | Freq: Four times a day (QID) | INTRAMUSCULAR | Status: DC | PRN

## 2021-09-15 MED ORDER — OXYCODONE-ACETAMINOPHEN 5-325 MG PO TABS: 1.0000 | ORAL_TABLET | ORAL | Status: DC | PRN

## 2021-09-15 MED ORDER — LIDOCAINE HCL (PF) 1 % IJ SOLN
30.0000 mL | INTRAMUSCULAR | Status: DC | PRN
Start: 2021-09-15 — End: 2021-09-15

## 2021-09-15 MED ORDER — SIMETHICONE 80 MG PO CHEW: 80.0000 mg | CHEWABLE_TABLET | ORAL | Status: DC | PRN

## 2021-09-15 MED ORDER — OXYTOCIN-SODIUM CHLORIDE 30-0.9 UT/500ML-% IV SOLN
2.5000 [IU]/h | INTRAVENOUS | Status: DC
  Filled 2021-09-15: qty 500

## 2021-09-15 MED ORDER — LACTATED RINGERS IV SOLN: 500.0000 mL | INTRAVENOUS | Status: DC | PRN

## 2021-09-15 MED ORDER — WITCH HAZEL-GLYCERIN EX PADS: 1.0000 | MEDICATED_PAD | CUTANEOUS | Status: DC | PRN

## 2021-09-15 MED ORDER — ONDANSETRON HCL 4 MG/2ML IJ SOLN: 4.0000 mg | INTRAMUSCULAR | Status: DC | PRN

## 2021-09-15 MED ORDER — PRENATAL MULTIVITAMIN CH
1.0000 | ORAL_TABLET | Freq: Every day | ORAL | Status: DC
  Administered 2021-09-16: 1 via ORAL
  Filled 2021-09-15: qty 1

## 2021-09-15 MED ORDER — ACETAMINOPHEN 325 MG PO TABS: 650.0000 mg | ORAL_TABLET | ORAL | Status: DC | PRN

## 2021-09-15 MED ORDER — DIPHENHYDRAMINE HCL 25 MG PO CAPS: 25.0000 mg | ORAL_CAPSULE | Freq: Four times a day (QID) | ORAL | Status: DC | PRN

## 2021-09-15 MED ORDER — TETANUS-DIPHTH-ACELL PERTUSSIS 5-2.5-18.5 LF-MCG/0.5 IM SUSY: 0.5000 mL | PREFILLED_SYRINGE | Freq: Once | INTRAMUSCULAR | Status: DC

## 2021-09-15 MED ORDER — TERBUTALINE SULFATE 1 MG/ML IJ SOLN: 0.2500 mg | Freq: Once | INTRAMUSCULAR | Status: DC | PRN

## 2021-09-15 MED ORDER — FENTANYL CITRATE (PF) 100 MCG/2ML IJ SOLN
50.0000 ug | INTRAMUSCULAR | Status: DC | PRN
  Administered 2021-09-15 (×4): 100 ug via INTRAVENOUS
  Filled 2021-09-15 (×4): qty 2

## 2021-09-15 MED ORDER — OXYTOCIN BOLUS FROM INFUSION
333.0000 mL | Freq: Once | INTRAVENOUS | Status: AC
  Administered 2021-09-15: 333 mL via INTRAVENOUS

## 2021-09-15 MED ORDER — MISOPROSTOL 200 MCG PO TABS
ORAL_TABLET | ORAL | Status: AC
  Filled 2021-09-15: qty 4

## 2021-09-15 MED ORDER — DIBUCAINE (PERIANAL) 1 % EX OINT: 1.0000 | TOPICAL_OINTMENT | CUTANEOUS | Status: DC | PRN

## 2021-09-15 MED ORDER — SOD CITRATE-CITRIC ACID 500-334 MG/5ML PO SOLN: 30.0000 mL | ORAL | Status: DC | PRN

## 2021-09-15 MED ORDER — OXYTOCIN-SODIUM CHLORIDE 30-0.9 UT/500ML-% IV SOLN: 1.0000 m[IU]/min | INTRAVENOUS | Status: DC

## 2021-09-15 MED ORDER — MEASLES, MUMPS & RUBELLA VAC IJ SOLR: 0.5000 mL | Freq: Once | INTRAMUSCULAR | Status: DC

## 2021-09-15 MED ORDER — COCONUT OIL OIL: 1.0000 | TOPICAL_OIL | Status: DC | PRN

## 2021-09-15 MED ORDER — METHYLERGONOVINE MALEATE 0.2 MG/ML IJ SOLN
INTRAMUSCULAR | Status: AC
  Filled 2021-09-15: qty 1

## 2021-09-15 MED ORDER — BENZOCAINE-MENTHOL 20-0.5 % EX AERO
1.0000 | INHALATION_SPRAY | CUTANEOUS | Status: DC | PRN
  Administered 2021-09-15 – 2021-09-17 (×2): 1 via TOPICAL
  Filled 2021-09-15 (×2): qty 56

## 2021-09-15 MED ORDER — MISOPROSTOL 25 MCG QUARTER TABLET: 25.0000 ug | ORAL_TABLET | ORAL | Status: DC | PRN

## 2021-09-15 MED ORDER — METHYLERGONOVINE MALEATE 0.2 MG/ML IJ SOLN
0.2000 mg | Freq: Once | INTRAMUSCULAR | Status: AC
  Administered 2021-09-15: 0.2 mg via INTRAMUSCULAR

## 2021-09-15 NOTE — Progress Notes (Signed)
Labor Progress Note Kim Callahan is a 21 y.o. G2P0010 at [redacted]w[redacted]d presented for labor  S:  Getting more uncomfortable with ctx. Requesting AROM to shorten labor.  O:  BP (!) 108/92   Pulse (!) 101   Temp 99.3 F (37.4 C) (Axillary)   Resp 16   Wt 68 kg   LMP 12/10/2020 (Exact Date)   SpO2 100%   BMI 25.75 kg/m  EFM: baseline 140 bpm/ mod variability/ + accels/ no decels  Toco/IUPC: 2-3 SVE: Dilation: 9 Effacement (%): 100 Cervical Position: Middle Station: 0, Plus 1 Presentation: Vertex Exam by:: Donette Larry, cnm AROM for thin MSF  A/P: 21 y.o. G2P0010 [redacted]w[redacted]d  1. Labor: active, progressing well 2. FWB: Cat I 3. Pain: water immersion   Consented for AROM. AROM performed for thin MSF. Ok to resume water immersion if pt desires for labor and birth. Anticipate SVD.  Donette Larry, CNM 3:50 PM

## 2021-09-15 NOTE — Lactation Note (Signed)
This note was copied from a baby's chart. Lactation Consultation Note  Patient Name: Kim Callahan [id number] Date: 09/15/2021 Reason for consult: L&D Initial assessment Age:21 hours Mom latched infant on her left breast using the football hold position, infant latched with depth, actively suckling and BF for 15 minutes. Mom will continue to breastfeed infant according to hunger cues, on demand 8 to 12+ times within 24 hours, skin to skin. Mom knows to call RN/LC on MBU for further latch assistance if needed. LC congratulated parents on the birth of their son. Maternal Data    Feeding Mother's Current Feeding Choice: Breast Milk  LATCH Score Latch: Grasps breast easily, tongue down, lips flanged, rhythmical sucking.  Audible Swallowing: Spontaneous and intermittent  Type of Nipple: Everted at rest and after stimulation  Comfort (Breast/Nipple): Soft / non-tender  Hold (Positioning): Full assist, staff holds infant at breast  LATCH Score: 8   Lactation Tools Discussed/Used    Interventions Interventions: Assisted with latch;Skin to skin;Breast compression;Adjust position;Support pillows;Position options;Education  Discharge    Consult Status Consult Status: Follow-up from L&D    Danelle Earthly 09/15/2021, 5:41 PM

## 2021-09-15 NOTE — Discharge Summary (Addendum)
Postpartum Discharge Summary     Patient Name: Kim Callahan DOB: 02/19/2001 MRN: [id number]  Date of admission: 09/15/2021 Delivery date:09/15/2021  Delivering provider: Julianne Handler  Date of discharge: 09/17/2021  Admitting diagnosis: Post term pregnancy over 40 weeks [O48.0] Intrauterine pregnancy: [redacted]w[redacted]d    Secondary diagnosis:  Active Problems:   SVD (spontaneous vaginal delivery)  Additional problems: none    Discharge diagnosis: Term Pregnancy Delivered                                              Post partum procedures: none Augmentation: AROM Complications: None  Hospital course: Onset of Labor With Vaginal Delivery      21y.o. yo G2P0010 at 354w6das admitted in Active Labor on 09/15/2021. Patient had an uncomplicated labor course as follows:  Membrane Rupture Time/Date: 3:39 PM ,09/15/2021   Delivery Method:Vaginal, Spontaneous  Episiotomy: None  Lacerations:  Periurethral;Labial  Patient had an uncomplicated postpartum course.  She is ambulating, tolerating a regular diet, passing flatus, and urinating well. Patient is discharged home in stable condition on 09/17/21.  Newborn Data: Birth date:09/15/2021  Birth time:4:13 PM  Gender:Female  Living status:Living  Apgars:9 ,9  Weight:3260 g   Magnesium Sulfate received: No BMZ received: No Rhophylac:N/A MMR:N/A T-DaP: declined Flu: No Transfusion:No  Physical exam  Vitals:   09/16/21 0820 09/16/21 1636 09/16/21 1950 09/17/21 0506  BP: 106/66 118/83 106/79 114/69  Pulse: 70 69 72 74  Resp: _0 Temp: 98.1 F (36.7 C) 97.8 F (36.6 C) 98.2 F (36.8 C) 98.3 F (36.8 C)  TempSrc: Oral Oral Oral Oral  SpO2: 100% 100% 100% 99%  Weight:       General: alert, cooperative, and no distress Lochia: appropriate Uterine Fundus: firm DVT Evaluation: No evidence of DVT seen on physical exam. No cords or calf tenderness. No significant calf/ankle edema. Labs: Lab Results  Component Value Date    WBC 6.8 09/15/2021   HGB 12.1 09/15/2021   HCT 36.2 09/15/2021   MCV 86.4 09/15/2021   PLT 182 09/15/2021      Latest Ref Rng & Units 11/14/2020   11:57 PM  CMP  Glucose 70 - 99 mg/dL 88   BUN 6 - 20 mg/dL 17   Creatinine 0.44 - 1.00 mg/dL 0.86   Sodium 135 - 145 mmol/L 134   Potassium 3.5 - 5.1 mmol/L 4.0   Chloride 98 - 111 mmol/L 103   CO2 22 - 32 mmol/L 22   Calcium 8.9 - 10.3 mg/dL 9.3   Total Protein 6.5 - 8.1 g/dL 7.6   Total Bilirubin 0.3 - 1.2 mg/dL 0.7   Alkaline Phos 38 - 126 U/L 52   AST 15 - 41 U/L 24   ALT 0 - 44 U/L 29    Edinburgh Score:    09/15/2021    6:16 PM  Edinburgh Postnatal Depression Scale Screening Tool  I have been able to laugh and see the funny side of things. 1  I have looked forward with enjoyment to things. 0  I have blamed myself unnecessarily when things went wrong. 2  I have been anxious or worried for no good reason. 2  I have felt scared or panicky for no good reason. 2  Things have been getting on top of me. 1  I have been so  unhappy that I have had difficulty sleeping. 1  I have felt sad or miserable. 1  I have been so unhappy that I have been crying. 1  The thought of harming myself has occurred to me. 0  Edinburgh Postnatal Depression Scale Total 11     After visit meds:  Allergies as of 09/17/2021       Reactions   Medroxyprogesterone Itching, Rash        Medication List     STOP taking these medications    amoxicillin 500 MG capsule Commonly known as: AMOXIL   Blood Pressure Kit Devi   Gojji Weight Scale Misc   terconazole 0.4 % vaginal cream Commonly known as: TERAZOL 7   terconazole 0.8 % vaginal cream Commonly known as: TERAZOL 3       TAKE these medications    albuterol 108 (90 Base) MCG/ACT inhaler Commonly known as: VENTOLIN HFA Inhale 1-2 puffs into the lungs every 6 (six) hours as needed for wheezing or shortness of breath.   ibuprofen 600 MG tablet Commonly known as: ADVIL Take 1 tablet  (600 mg total) by mouth every 6 (six) hours as needed for mild pain, moderate pain or cramping.   PrePLUS 27-1 MG Tabs Take 1 tablet by mouth daily.         Discharge home in stable condition Infant Feeding: Breast and donor milk as mom's milk is coming in slowly  Infant Disposition:home with mother Discharge instruction: per After Visit Summary and Postpartum booklet. Activity: Advance as tolerated. Pelvic rest for 6 weeks.  Diet: routine diet Future Appointments: Future Appointments  Date Time Provider [place]  10/28/2021 10:55 AM Woodroe Mode, MD [facility] None   Follow up Visit:   Please schedule this patient for a In person postpartum visit in 6 weeks with the following provider: Any provider. Additional Postpartum F/U: none   Low risk pregnancy complicated by:  none Delivery mode:  Vaginal, Spontaneous  Anticipated Birth Control:  POPs   09/17/2021 Ivin Booty  I spoke with and examined patient and agree with resident/PA-S/MS/SNM's note and plan of care. D/C home Roma Schanz, CNM, [facility] 09/17/2021 8:30 AM

## 2021-09-17 ENCOUNTER — Other Ambulatory Visit: Payer: Self-pay

## 2021-09-17 ENCOUNTER — Telehealth (HOSPITAL_COMMUNITY): Payer: Self-pay | Admitting: *Deleted

## 2021-09-17 ENCOUNTER — Ambulatory Visit: Payer: Medicaid Other

## 2021-09-17 DIAGNOSIS — Z1331 Encounter for screening for depression: Secondary | ICD-10-CM

## 2021-09-17 MED ORDER — IBUPROFEN 600 MG PO TABS: 600.0000 mg | ORAL_TABLET | Freq: Four times a day (QID) | ORAL | 0 refills | Status: AC | PRN

## 2021-09-17 NOTE — Telephone Encounter (Signed)
Patient scored 11 on EPDS during hospital stay. SW consult was completed and amb IBH referral made now. Dr. Alvester Morin notified via chart.  Duffy Rhody, RN 09-17-2021 at 12:32pm

## 2021-09-17 NOTE — Lactation Note (Signed)
This note was copied from a baby's chart. Lactation Consultation Note  Patient Name: Kim Callahan [id number] Date: 09/17/2021 Reason for consult: Follow-up assessment;Nipple pain/trauma;Primapara;1st time breastfeeding;Term;Infant weight loss;Other (Comment) (7 % weight loss, per mom breast are filling fuller and nipples sore. LC offered to assess breast tissue,no breakdown, areola edema.) Age:21 hours LC recommended due to 7 % weight loss and nipple tenderness/ areola edema prior to every latch until the sensitivity improves -  1st breast - breast massage, hand express, pre - pump, reverse pressure,  Latch by working with the baby to open wide and compressions until swallows and comfort has been achieved.  Sore nipple and engorgement prevention and tx. Shells provided.  Per mom active with [place] WIC and has the [facility] resource sheet.  Maternal Data Has patient been taught Hand Expression?: Yes (mom able to hand express without problems.)  Feeding Mother's Current Feeding Choice: Breast Milk and Donor Milk Nipple Type: Extra Slow Flow  LATCH Score ( By the [facility] )  Latch: Grasps breast easily, tongue down, lips flanged, rhythmical sucking.  Audible Swallowing: Spontaneous and intermittent  Type of Nipple: Everted at rest and after stimulation  Comfort (Breast/Nipple): Soft / non-tender  Hold (Positioning): Assistance needed to correctly position infant at breast and maintain latch.  LATCH Score: 9   Lactation Tools Discussed/Used    Interventions Interventions: Breast feeding basics reviewed;Hand express;Reverse pressure;Shells;Hand pump;Education;LC Services brochure  Discharge Discharge Education: Engorgement and breast care;Warning signs for feeding baby WIC Program: Yes  Consult Status Consult Status: Complete Date: 09/17/21 Follow-up type: In-patient    [name] 09/17/2021, 11:47 AM

## 2021-09-17 NOTE — Lactation Note (Signed)
This note was copied from a baby's chart. Lactation Consultation Note  Patient Name: Kim Callahan [id number] Date: 09/17/2021 Reason for consult: Follow-up assessment;Other (Comment) (NP asked LC to see mom. Mom just started breakfast and LC will F/U with mom prior to D/C.) Age:21 hours  Maternal Data    Feeding Mother's Current Feeding Choice: Breast Milk and Donor Milk  LATCH Score                    Lactation Tools Discussed/Used    Interventions    Discharge    Consult Status Consult Status: Follow-up Date: 09/17/21 Follow-up type: In-patient    [name] 09/17/2021, 9:55 AM

## 2021-09-19 ENCOUNTER — Encounter: Payer: Medicaid Other | Admitting: Family Medicine

## 2021-09-20 ENCOUNTER — Inpatient Hospital Stay (HOSPITAL_COMMUNITY): Payer: Medicaid Other

## 2021-09-20 ENCOUNTER — Inpatient Hospital Stay (HOSPITAL_COMMUNITY): Admit: 2021-09-20 | Payer: Medicaid Other | Source: Home / Self Care | Admitting: Obstetrics & Gynecology

## 2021-09-25 ENCOUNTER — Encounter: Payer: Self-pay | Admitting: Obstetrics and Gynecology

## 2021-10-01 ENCOUNTER — Ambulatory Visit (INDEPENDENT_AMBULATORY_CARE_PROVIDER_SITE_OTHER): Payer: Medicaid Other | Admitting: Licensed Clinical Social Worker

## 2021-10-01 DIAGNOSIS — F4322 Adjustment disorder with anxiety: Secondary | ICD-10-CM | POA: Diagnosis not present

## 2021-10-02 NOTE — BH Specialist Note (Signed)
Integrated Behavioral Health Follow Up In-Person Visit  MRN: [id number] Name: Magaby Rumberger  Number of Integrated Behavioral Health Clinician visits: 5-Fifth Visit  Session Start time: 0100   Session End time: 0128  Total time in minutes: 28 In person at [facility]   Types of Service: Individual psychotherapy  Interpretor:No. Interpretor Name and Language: none  Subjective: Rachele Gesell is a 21 y.o. female accompanied by Father of child Patient was referred by Dr Alvester Morin for positive depression screen . Patient reports the following symptoms/concerns: depressed mood and fatigue  Duration of problem: over 6 months; Severity of problem: mild  Objective: Mood: good  and Affect: Appropriate Risk of harm to self or others: No plan to harm self or others  Life Context: Family and Social: Lives with family in [place]  School/Work: n/a Self-Care: Resting  Life Changes: newborn   Patient and/or Family's Strengths/Protective Factors: Concrete supports in place (healthy food, safe environments, etc.)  Goals Addressed: Patient will:  Reduce symptoms of: depression   Increase knowledge and/or ability of: coping skills   Demonstrate ability to: Increase healthy adjustment to current life circumstances  Progress towards Goals: Ongoing  Interventions: Interventions utilized:  Mindfulness or Relaxation Training and Supportive Counseling Standardized Assessments completed: Inocente Salles Postnatal Depression  Patient and/or Family Response: Ms. Liggett responded well to in person ibh visit. Ms. Munro reports she is bonding well with newborn and family very supportive. Ms Stehle reports decrease appetite,however her mood has improved   Assessment: Patient currently experiencing adjustment disorder with anxiety.   Patient may benefit from integrated behavioral heath.  Plan: Follow up with behavioral health clinician on : prn Behavioral recommendations: Add small meals to diet  instead of snacking, prioritize rest, delegate task to prevent burnout  Referral(s): Integrated [employer] (In Clinic) "From scale of 1-10, how likely are you to follow plan?":    Gwyndolyn Saxon, LCSW

## 2021-10-28 ENCOUNTER — Other Ambulatory Visit (HOSPITAL_COMMUNITY)
Admission: RE | Admit: 2021-10-28 | Discharge: 2021-10-28 | Disposition: A | Discharge status: Home / Self Care | Payer: Medicaid Other | Source: Ambulatory Visit | Attending: Obstetrics & Gynecology | Admitting: Obstetrics & Gynecology

## 2021-10-28 ENCOUNTER — Ambulatory Visit (INDEPENDENT_AMBULATORY_CARE_PROVIDER_SITE_OTHER): Payer: Medicaid Other | Admitting: Obstetrics and Gynecology

## 2021-10-28 ENCOUNTER — Encounter: Payer: Self-pay | Admitting: Obstetrics and Gynecology

## 2021-10-28 VITALS — BP 118/81 | HR 98 | Ht 64.0 in | Wt 128.8 lb

## 2021-10-28 DIAGNOSIS — Z124 Encounter for screening for malignant neoplasm of cervix: Secondary | ICD-10-CM

## 2021-10-28 MED ORDER — ETONOGESTREL-ETHINYL ESTRADIOL 0.12-0.015 MG/24HR VA RING: VAGINAL_RING | VAGINAL | 3 refills | Status: AC

## 2021-10-28 NOTE — Progress Notes (Unsigned)
    Post Partum Visit Note  Kim Callahan is a 21 y.o. G2P0010 s/p 39wk SVD/peri-uretheral laceration (not repaired) after presenting in active labor.  Anesthesia: none. Postpartum course has been a little difficult at night, and patient reporting pain around clitoris. Baby is doing well. Baby is feeding by both breast and bottle - gerber gentle . Bowel function is normal. Bladder function is normal. Patient is not sexually active. Contraception method is abstinence. Postpartum depression screening: positive.  Patient thinks period may have started   Upstream - 10/28/21 1125       Pregnancy Intention Screening   Does the patient want to become pregnant in the next year? No    Does the patient's partner want to become pregnant in the next year? N/A    Would the patient like to discuss contraceptive options today? Yes      Contraception Wrap Up   Current Method No Method - Other Reason           Patient interested in vaginal ring for contraception.     Edinburgh Postnatal Depression Scale - 10/28/21 1121       Edinburgh Postnatal Depression Scale:  In the Past 7 Days   I have been able to laugh and see the funny side of things. 0    I have looked forward with enjoyment to things. 0    I have blamed myself unnecessarily when things went wrong. 2    I have been anxious or worried for no good reason. 2    I have felt scared or panicky for no good reason. 2    Things have been getting on top of me. 2    I have been so unhappy that I have had difficulty sleeping. 0    I have felt sad or miserable. 2    I have been so unhappy that I have been crying. 2    The thought of harming myself has occurred to me. 0    Edinburgh Postnatal Depression Scale Total 12            Review of Systems Pertinent items noted in HPI and remainder of comprehensive ROS otherwise negative.  Objective:  BP 118/81   Pulse 98   Ht 5\' 4"  (1.626 m)   Wt 128 lb 12.8 oz (58.4 kg)   LMP 10/24/2021  (Approximate)   Breastfeeding No   BMI 22.11 kg/m    General: NAD Abdomen: soft, nttp EGBUS: normal Vagina: scant old blood in vault Cervix: wnl Bimanual: wnl  Assessment:   Normal postpartum exam.   Plan:  *PP: pt interested in nuvaring; r/b/a d/w her and she has no contraindications. Ring sent in. Pap done today. Discomfort around the clitoris/urethral could be from healing process. I told her to give it another month to see if it resolves; area appears wnl on exam today *PP depression: followed by 12/24/2021  RTC: 71m for annual  1m, MD [facility], [facility]

## 2021-10-29 LAB — CYTOLOGY - PAP: Diagnosis: NEGATIVE

## 2021-12-16 ENCOUNTER — Other Ambulatory Visit: Payer: Self-pay | Admitting: Obstetrics and Gynecology

## 2021-12-16 ENCOUNTER — Other Ambulatory Visit: Payer: Self-pay | Admitting: Obstetrics

## 2021-12-16 DIAGNOSIS — B9689 Other specified bacterial agents as the cause of diseases classified elsewhere: Secondary | ICD-10-CM

## 2021-12-16 DIAGNOSIS — B3731 Acute candidiasis of vulva and vagina: Secondary | ICD-10-CM

## 2022-01-07 DIAGNOSIS — F3181 Bipolar II disorder: Secondary | ICD-10-CM | POA: Diagnosis not present

## 2022-01-07 DIAGNOSIS — Z0389 Encounter for observation for other suspected diseases and conditions ruled out: Secondary | ICD-10-CM | POA: Diagnosis not present

## 2022-01-13 DIAGNOSIS — Z6820 Body mass index (BMI) 20.0-20.9, adult: Secondary | ICD-10-CM | POA: Diagnosis not present

## 2022-01-13 DIAGNOSIS — N898 Other specified noninflammatory disorders of vagina: Secondary | ICD-10-CM | POA: Diagnosis not present

## 2022-01-15 ENCOUNTER — Other Ambulatory Visit: Payer: Self-pay

## 2022-01-15 DIAGNOSIS — Z6820 Body mass index (BMI) 20.0-20.9, adult: Secondary | ICD-10-CM | POA: Diagnosis not present

## 2022-01-15 DIAGNOSIS — N898 Other specified noninflammatory disorders of vagina: Secondary | ICD-10-CM | POA: Diagnosis not present

## 2022-01-15 DIAGNOSIS — B3731 Acute candidiasis of vulva and vagina: Secondary | ICD-10-CM | POA: Diagnosis not present

## 2022-01-15 DIAGNOSIS — B379 Candidiasis, unspecified: Secondary | ICD-10-CM

## 2022-01-15 MED ORDER — FLUCONAZOLE 150 MG PO TABS: 150.0000 mg | ORAL_TABLET | Freq: Once | ORAL | 0 refills | Status: AC

## 2022-01-23 DIAGNOSIS — Z20822 Contact with and (suspected) exposure to covid-19: Secondary | ICD-10-CM | POA: Diagnosis not present

## 2022-01-23 DIAGNOSIS — H6993 Unspecified Eustachian tube disorder, bilateral: Secondary | ICD-10-CM | POA: Diagnosis not present

## 2022-01-23 DIAGNOSIS — J069 Acute upper respiratory infection, unspecified: Secondary | ICD-10-CM | POA: Diagnosis not present

## 2022-03-02 DIAGNOSIS — Z6820 Body mass index (BMI) 20.0-20.9, adult: Secondary | ICD-10-CM | POA: Diagnosis not present

## 2022-03-02 DIAGNOSIS — Z020 Encounter for examination for admission to educational institution: Secondary | ICD-10-CM | POA: Diagnosis not present

## 2022-03-02 DIAGNOSIS — Z6379 Other stressful life events affecting family and household: Secondary | ICD-10-CM | POA: Diagnosis not present

## 2022-03-02 DIAGNOSIS — F418 Other specified anxiety disorders: Secondary | ICD-10-CM | POA: Diagnosis not present

## 2022-03-02 DIAGNOSIS — Z111 Encounter for screening for respiratory tuberculosis: Secondary | ICD-10-CM | POA: Diagnosis not present

## 2022-03-19 DIAGNOSIS — N898 Other specified noninflammatory disorders of vagina: Secondary | ICD-10-CM | POA: Diagnosis not present

## 2022-03-19 DIAGNOSIS — R300 Dysuria: Secondary | ICD-10-CM | POA: Diagnosis not present

## 2022-04-17 DIAGNOSIS — F4323 Adjustment disorder with mixed anxiety and depressed mood: Secondary | ICD-10-CM | POA: Diagnosis not present

## 2022-05-26 DIAGNOSIS — B349 Viral infection, unspecified: Secondary | ICD-10-CM | POA: Diagnosis not present

## 2022-05-26 DIAGNOSIS — Z20822 Contact with and (suspected) exposure to covid-19: Secondary | ICD-10-CM | POA: Diagnosis not present

## 2022-05-26 DIAGNOSIS — R059 Cough, unspecified: Secondary | ICD-10-CM | POA: Diagnosis not present

## 2022-05-26 DIAGNOSIS — Z6820 Body mass index (BMI) 20.0-20.9, adult: Secondary | ICD-10-CM | POA: Diagnosis not present

## 2022-07-01 DIAGNOSIS — Z1151 Encounter for screening for human papillomavirus (HPV): Secondary | ICD-10-CM | POA: Diagnosis not present

## 2022-07-01 DIAGNOSIS — Z139 Encounter for screening, unspecified: Secondary | ICD-10-CM | POA: Diagnosis not present

## 2022-07-01 DIAGNOSIS — Z01419 Encounter for gynecological examination (general) (routine) without abnormal findings: Secondary | ICD-10-CM | POA: Diagnosis not present

## 2022-07-01 DIAGNOSIS — Z23 Encounter for immunization: Secondary | ICD-10-CM | POA: Diagnosis not present

## 2022-07-01 DIAGNOSIS — R87610 Atypical squamous cells of undetermined significance on cytologic smear of cervix (ASC-US): Secondary | ICD-10-CM | POA: Diagnosis not present

## 2022-07-01 DIAGNOSIS — Z6820 Body mass index (BMI) 20.0-20.9, adult: Secondary | ICD-10-CM | POA: Diagnosis not present

## 2022-07-01 DIAGNOSIS — Z124 Encounter for screening for malignant neoplasm of cervix: Secondary | ICD-10-CM | POA: Diagnosis not present

## 2022-07-01 DIAGNOSIS — Z0000 Encounter for general adult medical examination without abnormal findings: Secondary | ICD-10-CM | POA: Diagnosis not present

## 2022-07-01 DIAGNOSIS — R87810 Cervical high risk human papillomavirus (HPV) DNA test positive: Secondary | ICD-10-CM | POA: Diagnosis not present

## 2022-07-16 DIAGNOSIS — Z1331 Encounter for screening for depression: Secondary | ICD-10-CM | POA: Diagnosis not present

## 2022-07-16 DIAGNOSIS — R87810 Cervical high risk human papillomavirus (HPV) DNA test positive: Secondary | ICD-10-CM | POA: Diagnosis not present

## 2022-07-16 DIAGNOSIS — R87610 Atypical squamous cells of undetermined significance on cytologic smear of cervix (ASC-US): Secondary | ICD-10-CM | POA: Diagnosis not present

## 2022-07-27 DIAGNOSIS — Z6820 Body mass index (BMI) 20.0-20.9, adult: Secondary | ICD-10-CM | POA: Diagnosis not present

## 2022-07-27 DIAGNOSIS — J3089 Other allergic rhinitis: Secondary | ICD-10-CM | POA: Diagnosis not present

## 2022-08-09 ENCOUNTER — Other Ambulatory Visit: Payer: Self-pay | Admitting: Obstetrics and Gynecology

## 2022-08-10 DIAGNOSIS — O219 Vomiting of pregnancy, unspecified: Secondary | ICD-10-CM | POA: Diagnosis not present

## 2022-08-10 DIAGNOSIS — Z3A01 Less than 8 weeks gestation of pregnancy: Secondary | ICD-10-CM | POA: Diagnosis not present

## 2022-08-10 DIAGNOSIS — O26891 Other specified pregnancy related conditions, first trimester: Secondary | ICD-10-CM | POA: Diagnosis not present

## 2022-08-10 DIAGNOSIS — R102 Pelvic and perineal pain: Secondary | ICD-10-CM | POA: Diagnosis not present

## 2022-08-10 DIAGNOSIS — Z3491 Encounter for supervision of normal pregnancy, unspecified, first trimester: Secondary | ICD-10-CM | POA: Diagnosis not present

## 2022-09-02 DIAGNOSIS — O039 Complete or unspecified spontaneous abortion without complication: Secondary | ICD-10-CM | POA: Diagnosis not present

## 2022-09-02 DIAGNOSIS — Z681 Body mass index (BMI) 19 or less, adult: Secondary | ICD-10-CM | POA: Diagnosis not present

## 2022-09-02 DIAGNOSIS — K644 Residual hemorrhoidal skin tags: Secondary | ICD-10-CM | POA: Diagnosis not present

## 2022-09-17 DIAGNOSIS — N898 Other specified noninflammatory disorders of vagina: Secondary | ICD-10-CM | POA: Diagnosis not present

## 2022-09-17 DIAGNOSIS — Z3201 Encounter for pregnancy test, result positive: Secondary | ICD-10-CM | POA: Diagnosis not present

## 2022-09-17 DIAGNOSIS — R300 Dysuria: Secondary | ICD-10-CM | POA: Diagnosis not present

## 2022-09-17 DIAGNOSIS — R350 Frequency of micturition: Secondary | ICD-10-CM | POA: Diagnosis not present

## 2022-09-17 DIAGNOSIS — N760 Acute vaginitis: Secondary | ICD-10-CM | POA: Diagnosis not present

## 2022-09-28 DIAGNOSIS — Z3201 Encounter for pregnancy test, result positive: Secondary | ICD-10-CM | POA: Diagnosis not present

## 2023-05-18 DIAGNOSIS — N898 Other specified noninflammatory disorders of vagina: Secondary | ICD-10-CM | POA: Diagnosis not present

## 2023-07-07 DIAGNOSIS — Z113 Encounter for screening for infections with a predominantly sexual mode of transmission: Secondary | ICD-10-CM | POA: Diagnosis not present

## 2023-07-07 DIAGNOSIS — Z01419 Encounter for gynecological examination (general) (routine) without abnormal findings: Secondary | ICD-10-CM | POA: Diagnosis not present
